# Patient Record
Sex: Female | Born: 1945 | Race: Asian | Hispanic: No | Marital: Married | Smoking: Never smoker
Health system: Southern US, Community
[De-identification: ages and names within clinical notes are randomized; demographics above are authoritative.]

## PROBLEM LIST (undated history)

## (undated) DIAGNOSIS — Z9889 Other specified postprocedural states: Secondary | ICD-10-CM

## (undated) DIAGNOSIS — M869 Osteomyelitis, unspecified: Secondary | ICD-10-CM

## (undated) HISTORY — PX: ABDOMINAL HYSTERECTOMY: SHX81

## (undated) HISTORY — DX: Other specified postprocedural states: Z98.890

## (undated) HISTORY — DX: Osteomyelitis, unspecified: M86.9

---

## 1998-10-09 ENCOUNTER — Inpatient Hospital Stay (HOSPITAL_COMMUNITY): Admission: EM | Admit: 1998-10-09 | Discharge: 1998-10-10 | Payer: Self-pay | Admitting: Emergency Medicine

## 1998-10-09 ENCOUNTER — Encounter: Payer: Self-pay | Admitting: Emergency Medicine

## 1999-04-11 ENCOUNTER — Other Ambulatory Visit: Admission: RE | Admit: 1999-04-11 | Discharge: 1999-04-11 | Payer: Self-pay | Admitting: Obstetrics and Gynecology

## 2006-11-24 ENCOUNTER — Observation Stay (HOSPITAL_COMMUNITY): Admission: EM | Admit: 2006-11-24 | Discharge: 2006-11-25 | Payer: Self-pay | Admitting: Family Medicine

## 2009-12-11 ENCOUNTER — Ambulatory Visit: Payer: Self-pay | Admitting: Vascular Surgery

## 2009-12-11 ENCOUNTER — Observation Stay (HOSPITAL_COMMUNITY): Admission: EM | Admit: 2009-12-11 | Discharge: 2009-12-13 | Payer: Self-pay | Admitting: Emergency Medicine

## 2009-12-11 ENCOUNTER — Encounter (INDEPENDENT_AMBULATORY_CARE_PROVIDER_SITE_OTHER): Payer: Self-pay | Admitting: Internal Medicine

## 2009-12-13 ENCOUNTER — Encounter: Payer: Self-pay | Admitting: Cardiology

## 2010-05-21 ENCOUNTER — Emergency Department (HOSPITAL_COMMUNITY): Admission: EM | Admit: 2010-05-21 | Discharge: 2010-05-21 | Payer: Self-pay | Admitting: Emergency Medicine

## 2011-02-19 LAB — COMPREHENSIVE METABOLIC PANEL
ALT: 28 U/L (ref 0–35)
AST: 16 U/L (ref 0–37)
AST: 22 U/L (ref 0–37)
Albumin: 3.9 g/dL (ref 3.5–5.2)
Albumin: 4.1 g/dL (ref 3.5–5.2)
Alkaline Phosphatase: 51 U/L (ref 39–117)
BUN: 11 mg/dL (ref 6–23)
BUN: 8 mg/dL (ref 6–23)
Chloride: 103 mEq/L (ref 96–112)
Chloride: 106 mEq/L (ref 96–112)
GFR calc Af Amer: >60 mL/min (ref >60)
Glucose, Bld: 110 mg/dL — ABNORMAL HIGH (ref 70–99)
Potassium: 4.3 mEq/L (ref 3.5–5.1)
Potassium: 3.9 mEq/L (ref 3.5–5.1)
Sodium: 141 mEq/L (ref 135–145)
Total Bilirubin: 1.3 mg/dL — ABNORMAL HIGH (ref 0.3–1.2)
Total Bilirubin: 0.8 mg/dL (ref 0.3–1.2)
Total Protein: 7.1 g/dL (ref 6.0–8.3)
Total Protein: 7.2 g/dL (ref 6.0–8.3)

## 2011-02-19 LAB — HEMOGLOBIN A1C
Hgb A1c MFr Bld: 5.9 % (ref 4.6–6.1)
Mean Plasma Glucose: 123 mg/dL

## 2011-02-19 LAB — PROTIME-INR
INR: 1.01 (ref 0.00–1.49)
Prothrombin Time: 13.2 seconds (ref 11.6–15.2)

## 2011-02-19 LAB — LIPID PANEL
Cholesterol: 231 mg/dL — ABNORMAL HIGH (ref 0–200)
Triglycerides: 118 mg/dL (ref <150)
VLDL: 24 mg/dL (ref 0–40)

## 2011-02-19 LAB — BASIC METABOLIC PANEL
Chloride: 103 mEq/L (ref 96–112)
Creatinine, Ser: 0.74 mg/dL (ref 0.4–1.2)
GFR calc Af Amer: >60 mL/min (ref >60)
GFR calc non Af Amer: >60 mL/min (ref >60)

## 2011-02-19 LAB — DIFFERENTIAL
Basophils Absolute: 0 10*3/uL (ref 0.0–0.1)
Basophils Absolute: 0 10*3/uL (ref 0.0–0.1)
Basophils Relative: 0 % (ref 0–1)
Basophils Relative: 0 % (ref 0–1)
Eosinophils Absolute: 0.1 10*3/uL (ref 0.0–0.7)
Eosinophils Absolute: 0 10*3/uL (ref 0.0–0.7)
Eosinophils Relative: 2 % (ref 0–5)
Eosinophils Relative: 0 % (ref 0–5)
Lymphocytes Relative: 33 % (ref 12–46)
Monocytes Absolute: 0.2 10*3/uL (ref 0.1–1.0)
Monocytes Relative: 5 % (ref 3–12)
Neutro Abs: 2.7 10*3/uL (ref 1.7–7.7)
Neutrophils Relative %: 60 % (ref 43–77)

## 2011-02-19 LAB — CK TOTAL AND CKMB (NOT AT ARMC)
CK, MB: 0.7 ng/mL (ref 0.3–4.0)
CK, MB: 0.8 ng/mL (ref 0.3–4.0)
CK, MB: 1.1 ng/mL (ref 0.3–4.0)
CK, MB: 1.3 ng/mL (ref 0.3–4.0)
Relative Index: INVALID (ref 0.0–2.5)
Relative Index: INVALID (ref 0.0–2.5)
Total CK: 62 U/L (ref 7–177)

## 2011-02-19 LAB — CARDIAC PANEL(CRET KIN+CKTOT+MB+TROPI)
CK, MB: 0.5 ng/mL (ref 0.3–4.0)
Relative Index: INVALID (ref 0.0–2.5)
Relative Index: INVALID (ref 0.0–2.5)
Total CK: 53 U/L (ref 7–177)
Troponin I: <0.01 ng/mL (ref 0.00–0.06)

## 2011-02-19 LAB — POCT CARDIAC MARKERS
CKMB, poc: <1 ng/mL — ABNORMAL LOW (ref 1.0–8.0)
Myoglobin, poc: 42.3 ng/mL (ref 12–200)
Myoglobin, poc: 47.5 ng/mL (ref 12–200)
Troponin i, poc: <0.05 ng/mL (ref 0.00–0.09)

## 2011-02-19 LAB — URINALYSIS, ROUTINE W REFLEX MICROSCOPIC
Hgb urine dipstick: NEGATIVE
Nitrite: NEGATIVE
Protein, ur: NEGATIVE mg/dL
Specific Gravity, Urine: 1.012 (ref 1.005–1.030)
Urobilinogen, UA: 0.2 mg/dL (ref 0.0–1.0)

## 2011-02-19 LAB — CBC
HCT: 41.5 % (ref 36.0–46.0)
MCV: 93.8 fL (ref 78.0–100.0)
Platelets: 257 10*3/uL (ref 150–400)
Platelets: 206 10*3/uL (ref 150–400)
WBC: 4.6 10*3/uL (ref 4.0–10.5)
WBC: 4.6 10*3/uL (ref 4.0–10.5)

## 2011-02-19 LAB — TROPONIN I: Troponin I: 0.02 ng/mL (ref 0.00–0.06)

## 2011-04-21 NOTE — H&P (Signed)
Sara Mason, Sara Mason              ACCOUNT NO.:  [id number]   MEDICAL RECORD NO.:  [id number]          PATIENT TYPE:  OBV   LOCATION:  3027                         FACILITY:  [facility]   PHYSICIAN:  Michelene Gardener, MD    DATE OF BIRTH:  [birth year]   DATE OF ADMISSION:  11/23/2006  DATE OF DISCHARGE:  11/25/2006                              HISTORY & PHYSICAL   PRIMARY PHYSICIAN:  Chales Salmon. Abigail Miyamoto, M.D.   DISCHARGE DIAGNOSES:  Transient global amnesia.   DISCHARGE MEDICATIONS:  None.   CONSULTATIONS:  Neurology consult with Dr. Vickey Huger.   PROCEDURES:  1. A CT scan of the head, came to be normal.  2. MRI of the head came to be normal.   FOLLOWUP APPOINTMENT:  1. Dr. Pricilla Holm in one to two weeks.  2. Dr. Vickey Huger in seven to ten days.   GENERAL CONDITION AT THE TIME OF DISCHARGE:  Patient is stable.   REASON FOR HOSPITALIZATION:  This is a 65 year old female who brought by  her family because she developed acute onset of amnesia where she cannot  remember anything.   COURSE OF HOSPITALIZATION BY MEDICAL PROBLEM:  This patient was seen in  the ER.  CT scan of the head was done in the ER and it came to be  normal.  MRI of the brain was also done in the emergency room and again  it was normal.  Patient was admitted to the floor for observation and  her condition improved very quick.  Over 2 days she is almost back to  normal.  She is able to remember things.  Neurology consultation was  done by Dr. Vickey Huger who stated that patient will be okay to go home and  needs followup with neurologist on outpatient.  The  patient will be given an appointment to see Dr. Vickey Huger in 7 to 10  days.  An EEG will be done as an outpatient for further assessment.  At  the time of discharge, patient is stable, is almost back to normal with  no new complaints.  Assessment time is 40 minutes.      Michelene Gardener, MD  Electronically Signed     NAE/MEDQ  D:  11/25/2006  T:  11/26/2006  Job:   [id number]   cc:   Melvyn Novas, M.D.  Chales Salmon. Abigail Miyamoto, M.D.

## 2011-04-21 NOTE — H&P (Signed)
Sara Mason, FRIESEN              ACCOUNT NO.:  [id number]   MEDICAL RECORD NO.:  [id number]          PATIENT TYPE:  EMS   LOCATION:  MAJO                         FACILITY:  [facility]   PHYSICIAN:  Melissa L. Ladona Ridgel, MD  DATE OF BIRTH:  [birth year]   DATE OF ADMISSION:  11/24/2006  DATE OF DISCHARGE:                              HISTORY & PHYSICAL   PRIMARY CARE PHYSICIAN:  Dr. Abigail Miyamoto.   CHIEF COMPLAINT:  Memory loss, acute in onset.   HISTORY OF PRESENT ILLNESS:  The patient is a 65 year old Asian female  who presents from her home with acute onset of global amnesia.  The  patient evidently went out to the pastures to put her horses up and  returned to the house sometime later.  She called out to her husband and  stated, I think I have a concussion.  He inquired as to why she  thought that and noted that she was displaying a similar pattern of  memory deficit as she had approximately 10 years ago.  The patient's  spouse relates that 10 years ago the patient had a similar traumatic  concussion.  Memory deficits were noted where she was not able to  identify simple things for approximately 1 day; symptoms resolved with  no further memory deficit.  At this time, the patient complains of mild  headache, but no other traumatic areas are noted and no marks were noted  on her body.  She did have some shin pain that was there with flexion of  her legs, but again, no dirtied clothes, no evidence of being kicked or  knocked down in the dirt.   REVIEW OF SYSTEMS:  Currently a slight headache.  No stiffness.  She has  had a swollen eye, but that has been there for a couple of days and  actually has mild injection.   PAST MEDICAL HISTORY:  None.   ALLERGIES:  She does have an allergic reaction to BEE STINGS and so she  carries an EpiPen.  She is allergic to NSAIDS.   PAST SURGICAL HISTORY:  1. She had a hysterectomy.  2. She has had dental procedures.   SOCIAL HISTORY:  She denies  tobacco.  She does have occasional wine.  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   FAMILY HISTORY:  Dad is deceased.  Mom is living with diabetes and  bypass, as well as a history of dementia.   MEDICATIONS:  None.   PHYSICAL EXAMINATION:  VITAL SIGNS:  Temperature is 99.4, blood pressure  122/72, pulse is 81, respirations 20, saturation 97%.  GENERAL:  This is a well-nourished, well-developed Asian female in no  acute distress, other than perseverating questions of a simple nature  including her clothes and why she is in the hospital.  HEENT:  She is normocephalic, does not appear to have any trauma.  Her  tympanic membranes are intact without any evidence of blood behind them.  Her right eye has mild injection in the corner.  Her mucous membranes  are moist.  Funduscopic exam reveals no ruptured blood vessels.  NECK:  Supple.  There is no JVD, no lymph nodes and no tenderness to her  spine.  There are no carotid bruits, no thyromegaly.  CHEST:  Clear to auscultation.  There are no rhonchi, rales or wheezes.  CARDIOVASCULAR:  Regular rate and rhythm, positive S1 and S2.  No S3 or  S4.  No murmurs, rubs, or gallops.  ABDOMEN:  Soft, nontender and non-distended with positive bowel sounds.  EXTREMITIES:  No clubbing, cyanosis or edema, or injury and she appears  nontender.   LABORATORY DATA:  There are no current laboratory values.   ASSESSMENT AND PLAN:  This is a 65 year old Asian female with  transglobal amnesia of unclear inciting factor.  There is no evidence  for acute trauma; however, a headbutt by a horse may not necessarily  have left her with injury, but she does not appear to have gone down or  been kicked.  CT and MRI show no preliminary acute changes.  We will  admit the patient for observation.   1. Admit for observation.  Neurological checks every 2 hours.  If her      cognitive capacity is not clearing in the morning, then we will      consult Neurology.   Please note that the emergency room did speak      with the neurologist on call; he felt that general observation was      appropriate.  2. We will check a BMET, CBC, UA and TSH.      Melissa L. Ladona Ridgel, MD  Electronically Signed     MLT/MEDQ  D:  11/24/2006  T:  11/24/2006  Job:  [id number]   cc:   Chales Salmon. Abigail Miyamoto, M.D.

## 2011-04-21 NOTE — Consult Note (Signed)
Sara Mason, DUKES              ACCOUNT NO.:  [id number]   MEDICAL RECORD NO.:  [id number]          PATIENT TYPE:  OBV   LOCATION:  3027                         FACILITY:  [facility]   PHYSICIAN:  Melvyn Novas, M.D.  DATE OF BIRTH:  1946/01/21   DATE OF CONSULTATION:  11/24/2006  DATE OF DISCHARGE:  11/25/2006                                 CONSULTATION   The patient is in the outpatient setting followed by Dr. Henrine Screws.   CHIEF COMPLAINT:  Memory loss, acute in onset amnesia.   HISTORY OF PRESENT ILLNESS:  This patient is a 65 year old Asian  American female who presents from her home with acute onset of global  amnesia.  She was [facility] her [facility] and then to [facility] for a CT scan.  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  Last night she went out to  the pastures to bring the horses into the stables, and she returned to  the house sometimes later.  Her husband heard her calling out.  She  stated to him, I think I had a concussion, and she explained that she  cannot remember how she got back to the house or what she did out in the  pasture.  She considered that she might have been kicked by a horse,  because she found herself on the ground not knowing where she was.  Eight years ago she had a similar amnestic event after a trauma, and it  was therefore a conclusion that this might have been of repeated  incident, although, she does not recall being in any way injured.  This  morning, she felt a sore spot on her right occipital parietal scalp.  She also had a sore spot on the inside of her left calf, but she has no  bruising.  There is a tongue bite mark on her left edge of the tongue.  She has no history of seizures.   REVIEW OF SYSTEMS:  The patient had a slight headache last night which  has now resolved.  She has no stiffness.  She does have a tender spot as  described above.   PAST MEDICAL  HISTORY:  Negative.   ALLERGIES:  No known drug allergies, but was told not to take NSAIDs.   PAST SURGICAL HISTORY:  Positive for hysterectomy and several dental  procedures.  The patient is a nonsmoker, nondrinker, except for  occasional wine.  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   FAMILY HISTORY:  Her father is deceased.  Her mother is living with  diabetes and coronary artery disease, coronary stenting and bypass  surgery as well as developed dementia.  She is on no medications at this  point.   PHYSICAL EXAMINATION:  VITAL SIGNS:  Temperature is 98.7, blood pressure  is 102/68, pulse rate is 62 and regular, respirations 16.  LUNGS:  Clear to auscultation.  Oxygen saturation on room air 98%.  GENERAL:  The patient is well-nourished, well-groomed a fully alert.  She is at this time fully  oriented in no acute distress.  She has not  continued to perseverate questions as described this morning by her  admitting physician.  HEENT:  She is normocephalic and traumatic except for the tongue bite.  Funduscopic examination has no __________  NECK:  Supple.  No neck vein distension.  No clubbing, cyanosis or  edema.  NEUROLOGICAL:  Cranial nerve examination shows bilaterally equal  extraocular movements, bilateral equal eye closure.  Tongue and uvula  move in midline.  There is no facial asymmetry noted.  Motor examination  shows 5/5 strength bilaterally without rigor, cogwheeling or tremor.  No  drift on finger-nose test.  Bilateral equal grip strength.  No  dysmetria.  No ataxia.  Gait is unimpaired.  The patient had walked to  the bathroom several times today.  Deep tendon reflexes are symmetric.  Downgoing toes to plantar stimulation.  Sensory:  Intact to primary  modalities.  Antigravity movement for over 10 seconds could be performed  in all four extremities.   CONCLUSION:  Likely suffering from global amnesia.  It is unclear to me  if the patient has truly had a trauma or  if it could have been a seizure  given the tongue bite.  I suggested since it is a holiday weekend that  the patient will be discharged tomorrow.  For this might, I would like  her to have an EKG electrode to just use the benefit of being  hospitalized and evaluate her for any cardiac arrhythmia.  Tomorrow, I  would like her to be discharged home, to keep rest at home for the next  10 days, and I would like to follow-up with her in 14 days in the  outpatient setting at my office at [facility], [address], [address].  I would also like to obtain in the  outpatient setting an EEG.   ADDENDUM:  The patient had a normal CT and MRI scan which I reviewed  here in the hospital.      Melvyn Novas, M.D.  Electronically Signed     CD/MEDQ  D:  11/24/2006  T:  11/26/2006  Job:  [id number]   cc:   Chales Salmon. Abigail Miyamoto, M.D.  Melissa L. Ladona Ridgel, MD

## 2012-04-05 ENCOUNTER — Other Ambulatory Visit: Payer: Self-pay | Admitting: Cardiology

## 2015-05-31 DIAGNOSIS — H532 Diplopia: Secondary | ICD-10-CM | POA: Diagnosis not present

## 2015-05-31 DIAGNOSIS — H52203 Unspecified astigmatism, bilateral: Secondary | ICD-10-CM | POA: Diagnosis not present

## 2016-01-05 DIAGNOSIS — Z1231 Encounter for screening mammogram for malignant neoplasm of breast: Secondary | ICD-10-CM | POA: Diagnosis not present

## 2016-03-24 DIAGNOSIS — Z86010 Personal history of colonic polyps: Secondary | ICD-10-CM | POA: Diagnosis not present

## 2016-05-11 DIAGNOSIS — S32000A Wedge compression fracture of unspecified lumbar vertebra, initial encounter for closed fracture: Secondary | ICD-10-CM | POA: Diagnosis not present

## 2016-05-11 DIAGNOSIS — M5442 Lumbago with sciatica, left side: Secondary | ICD-10-CM | POA: Diagnosis not present

## 2016-05-23 DIAGNOSIS — S32000D Wedge compression fracture of unspecified lumbar vertebra, subsequent encounter for fracture with routine healing: Secondary | ICD-10-CM | POA: Diagnosis not present

## 2016-05-30 DIAGNOSIS — M5442 Lumbago with sciatica, left side: Secondary | ICD-10-CM | POA: Diagnosis not present

## 2016-05-30 DIAGNOSIS — S32010D Wedge compression fracture of first lumbar vertebra, subsequent encounter for fracture with routine healing: Secondary | ICD-10-CM | POA: Diagnosis not present

## 2017-02-06 DIAGNOSIS — G43909 Migraine, unspecified, not intractable, without status migrainosus: Secondary | ICD-10-CM | POA: Diagnosis not present

## 2017-04-11 DIAGNOSIS — H2513 Age-related nuclear cataract, bilateral: Secondary | ICD-10-CM | POA: Diagnosis not present

## 2017-04-11 DIAGNOSIS — H4320 Crystalline deposits in vitreous body, unspecified eye: Secondary | ICD-10-CM | POA: Diagnosis not present

## 2017-07-13 DIAGNOSIS — Z1231 Encounter for screening mammogram for malignant neoplasm of breast: Secondary | ICD-10-CM | POA: Diagnosis not present

## 2017-10-29 DIAGNOSIS — H35073 Retinal telangiectasis, bilateral: Secondary | ICD-10-CM | POA: Diagnosis not present

## 2018-05-06 DIAGNOSIS — H35073 Retinal telangiectasis, bilateral: Secondary | ICD-10-CM | POA: Diagnosis not present

## 2018-08-14 DIAGNOSIS — Z1231 Encounter for screening mammogram for malignant neoplasm of breast: Secondary | ICD-10-CM | POA: Diagnosis not present

## 2018-11-06 DIAGNOSIS — H52203 Unspecified astigmatism, bilateral: Secondary | ICD-10-CM | POA: Diagnosis not present

## 2018-11-06 DIAGNOSIS — H2513 Age-related nuclear cataract, bilateral: Secondary | ICD-10-CM | POA: Diagnosis not present

## 2018-11-06 DIAGNOSIS — H35073 Retinal telangiectasis, bilateral: Secondary | ICD-10-CM | POA: Diagnosis not present

## 2019-08-20 DIAGNOSIS — Z1231 Encounter for screening mammogram for malignant neoplasm of breast: Secondary | ICD-10-CM | POA: Diagnosis not present

## 2019-08-20 DIAGNOSIS — Z803 Family history of malignant neoplasm of breast: Secondary | ICD-10-CM | POA: Diagnosis not present

## 2019-11-12 DIAGNOSIS — H2513 Age-related nuclear cataract, bilateral: Secondary | ICD-10-CM | POA: Diagnosis not present

## 2019-11-12 DIAGNOSIS — H35383 Toxic maculopathy, bilateral: Secondary | ICD-10-CM | POA: Diagnosis not present

## 2019-11-12 DIAGNOSIS — H5201 Hypermetropia, right eye: Secondary | ICD-10-CM | POA: Diagnosis not present

## 2020-09-08 DIAGNOSIS — Z1231 Encounter for screening mammogram for malignant neoplasm of breast: Secondary | ICD-10-CM | POA: Diagnosis not present

## 2020-09-24 DIAGNOSIS — R928 Other abnormal and inconclusive findings on diagnostic imaging of breast: Secondary | ICD-10-CM | POA: Diagnosis not present

## 2020-11-16 DIAGNOSIS — H52203 Unspecified astigmatism, bilateral: Secondary | ICD-10-CM | POA: Diagnosis not present

## 2020-11-16 DIAGNOSIS — H2513 Age-related nuclear cataract, bilateral: Secondary | ICD-10-CM | POA: Diagnosis not present

## 2020-11-16 DIAGNOSIS — H35383 Toxic maculopathy, bilateral: Secondary | ICD-10-CM | POA: Diagnosis not present

## 2020-12-23 DIAGNOSIS — R7302 Impaired glucose tolerance (oral): Secondary | ICD-10-CM | POA: Diagnosis not present

## 2020-12-23 DIAGNOSIS — E785 Hyperlipidemia, unspecified: Secondary | ICD-10-CM | POA: Diagnosis not present

## 2020-12-30 DIAGNOSIS — E785 Hyperlipidemia, unspecified: Secondary | ICD-10-CM | POA: Diagnosis not present

## 2020-12-30 DIAGNOSIS — Z0000 Encounter for general adult medical examination without abnormal findings: Secondary | ICD-10-CM | POA: Diagnosis not present

## 2020-12-30 DIAGNOSIS — R7302 Impaired glucose tolerance (oral): Secondary | ICD-10-CM | POA: Diagnosis not present

## 2021-04-20 DIAGNOSIS — R202 Paresthesia of skin: Secondary | ICD-10-CM | POA: Diagnosis not present

## 2021-04-20 DIAGNOSIS — R7302 Impaired glucose tolerance (oral): Secondary | ICD-10-CM | POA: Diagnosis not present

## 2021-04-20 DIAGNOSIS — M79662 Pain in left lower leg: Secondary | ICD-10-CM | POA: Diagnosis not present

## 2021-04-20 DIAGNOSIS — E785 Hyperlipidemia, unspecified: Secondary | ICD-10-CM | POA: Diagnosis not present

## 2021-05-13 DIAGNOSIS — R300 Dysuria: Secondary | ICD-10-CM | POA: Diagnosis not present

## 2021-11-21 DIAGNOSIS — H35363 Drusen (degenerative) of macula, bilateral: Secondary | ICD-10-CM | POA: Diagnosis not present

## 2021-11-21 DIAGNOSIS — H2513 Age-related nuclear cataract, bilateral: Secondary | ICD-10-CM | POA: Diagnosis not present

## 2021-11-21 DIAGNOSIS — H524 Presbyopia: Secondary | ICD-10-CM | POA: Diagnosis not present

## 2021-12-28 DIAGNOSIS — R7302 Impaired glucose tolerance (oral): Secondary | ICD-10-CM | POA: Diagnosis not present

## 2021-12-28 DIAGNOSIS — E785 Hyperlipidemia, unspecified: Secondary | ICD-10-CM | POA: Diagnosis not present

## 2022-01-04 DIAGNOSIS — R82998 Other abnormal findings in urine: Secondary | ICD-10-CM | POA: Diagnosis not present

## 2022-01-04 DIAGNOSIS — E785 Hyperlipidemia, unspecified: Secondary | ICD-10-CM | POA: Diagnosis not present

## 2022-01-04 DIAGNOSIS — Z1331 Encounter for screening for depression: Secondary | ICD-10-CM | POA: Diagnosis not present

## 2022-01-04 DIAGNOSIS — Z1339 Encounter for screening examination for other mental health and behavioral disorders: Secondary | ICD-10-CM | POA: Diagnosis not present

## 2022-01-04 DIAGNOSIS — R7302 Impaired glucose tolerance (oral): Secondary | ICD-10-CM | POA: Diagnosis not present

## 2022-01-04 DIAGNOSIS — Z0000 Encounter for general adult medical examination without abnormal findings: Secondary | ICD-10-CM | POA: Diagnosis not present

## 2022-01-05 ENCOUNTER — Other Ambulatory Visit: Payer: Self-pay | Admitting: Internal Medicine

## 2022-01-05 DIAGNOSIS — E785 Hyperlipidemia, unspecified: Secondary | ICD-10-CM

## 2022-01-12 DIAGNOSIS — Z1231 Encounter for screening mammogram for malignant neoplasm of breast: Secondary | ICD-10-CM | POA: Diagnosis not present

## 2022-02-14 ENCOUNTER — Ambulatory Visit
Admission: RE | Admit: 2022-02-14 | Discharge: 2022-02-14 | Disposition: A | Discharge status: Home / Self Care | Payer: No Typology Code available for payment source | Source: Ambulatory Visit | Attending: Internal Medicine | Admitting: Internal Medicine

## 2022-02-14 DIAGNOSIS — E785 Hyperlipidemia, unspecified: Secondary | ICD-10-CM

## 2022-04-20 DIAGNOSIS — Z86010 Personal history of colonic polyps: Secondary | ICD-10-CM | POA: Diagnosis not present

## 2022-04-27 ENCOUNTER — Other Ambulatory Visit: Payer: Self-pay | Admitting: Gastroenterology

## 2022-04-27 DIAGNOSIS — Z86010 Personal history of colonic polyps: Secondary | ICD-10-CM

## 2022-06-22 ENCOUNTER — Ambulatory Visit
Admission: RE | Admit: 2022-06-22 | Discharge: 2022-06-22 | Disposition: A | Discharge status: Home / Self Care | Payer: Medicare HMO | Source: Ambulatory Visit | Attending: Gastroenterology | Admitting: Gastroenterology

## 2022-06-22 DIAGNOSIS — Z86010 Personal history of colonic polyps: Secondary | ICD-10-CM

## 2022-08-02 DIAGNOSIS — L280 Lichen simplex chronicus: Secondary | ICD-10-CM | POA: Diagnosis not present

## 2022-09-18 DIAGNOSIS — N8111 Cystocele, midline: Secondary | ICD-10-CM | POA: Diagnosis not present

## 2022-10-03 DIAGNOSIS — N8110 Cystocele, unspecified: Secondary | ICD-10-CM | POA: Diagnosis not present

## 2022-10-03 DIAGNOSIS — Z4689 Encounter for fitting and adjustment of other specified devices: Secondary | ICD-10-CM | POA: Diagnosis not present

## 2022-10-17 DIAGNOSIS — Z4689 Encounter for fitting and adjustment of other specified devices: Secondary | ICD-10-CM | POA: Diagnosis not present

## 2022-12-02 IMAGING — CT CT CARDIAC CORONARY ARTERY CALCIUM SCORE
3 series · 13 of 20 positions shown, 15 images · non-contrast
Comparison: None.

CLINICAL DATA: 75-year-old Asian female with history of
hyperlipidemia and family history of heart disease.

EXAM:
CT CARDIAC CORONARY ARTERY CALCIUM SCORE
TECHNIQUE: Non-contrast imaging through the heart was performed using
prospective ECG gating. Image post processing was performed on an
independent workstation, allowing for quantitative analysis of the
heart and coronary arteries. Note that this exam targets the heart
and the chest was not imaged in its entirety.

[Series 2: calcium scoring 2.00 qr36 bestdiast 70% hrt calciu · axial · 0.36mm/px · z∈[+1458,+1522]mm · 3 of 80 slices shown]
[im 16/80  vessel]
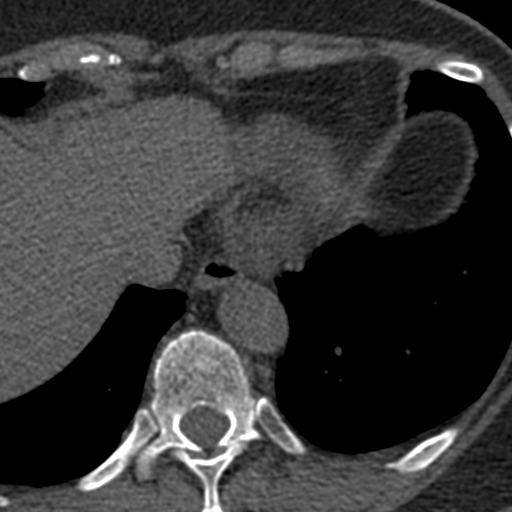
[im 32/80  vessel]
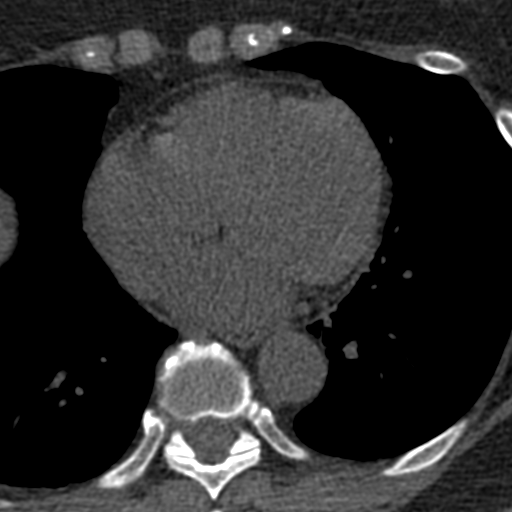
[im 48/80  vessel]
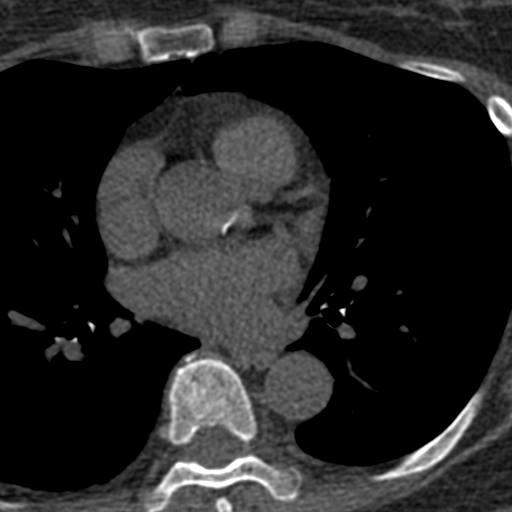

[Series 3: calcium scoring 2.00 br40 bestdiast 70% axial · axial · 0.54mm/px · z∈[+1454,+1558]mm · 5 of 80 slices shown, 7 images]
[im 14/80  vessel]
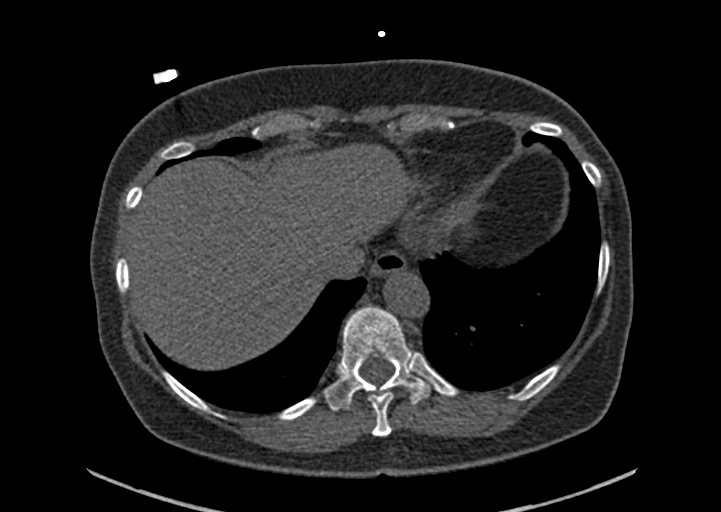
[im 14/80  lung]
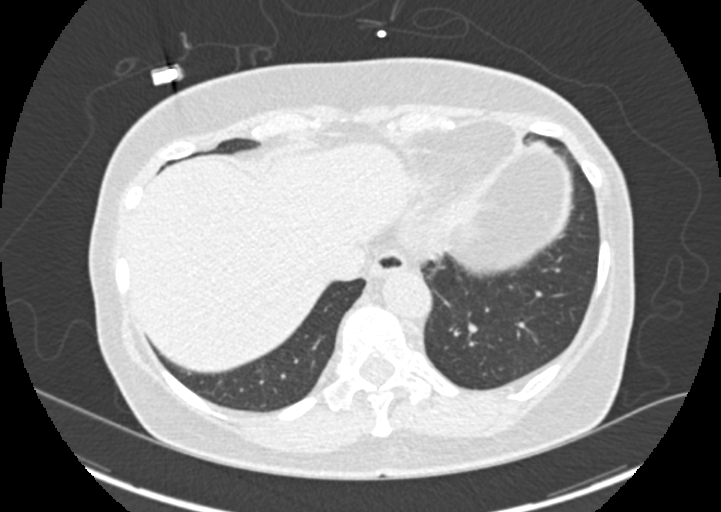
[im 27/80  vessel]
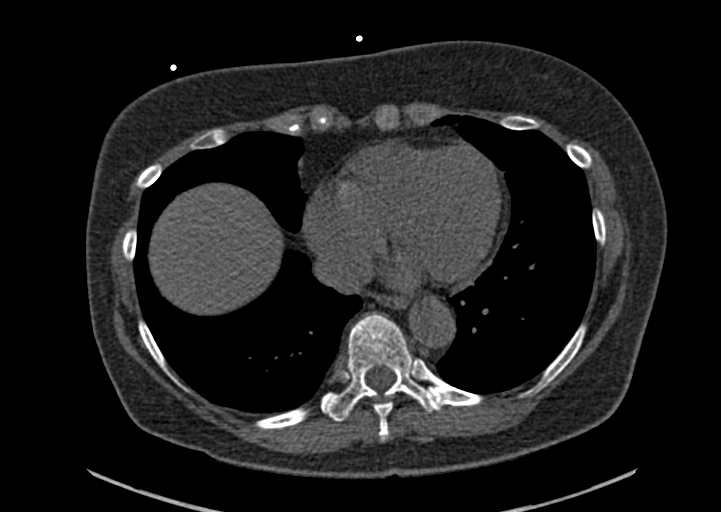
[im 40/80  vessel]
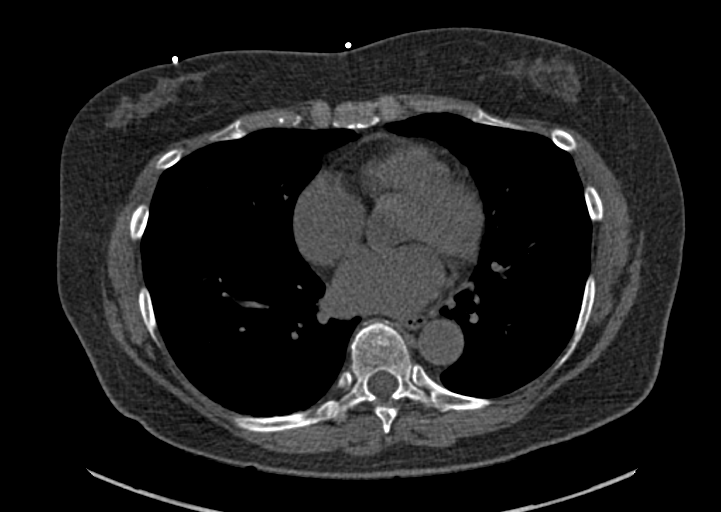
[im 53/80  vessel]
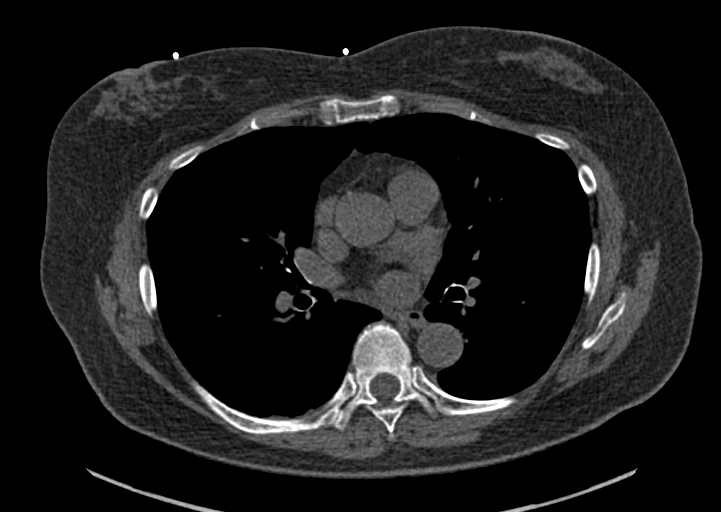
[im 66/80  vessel]
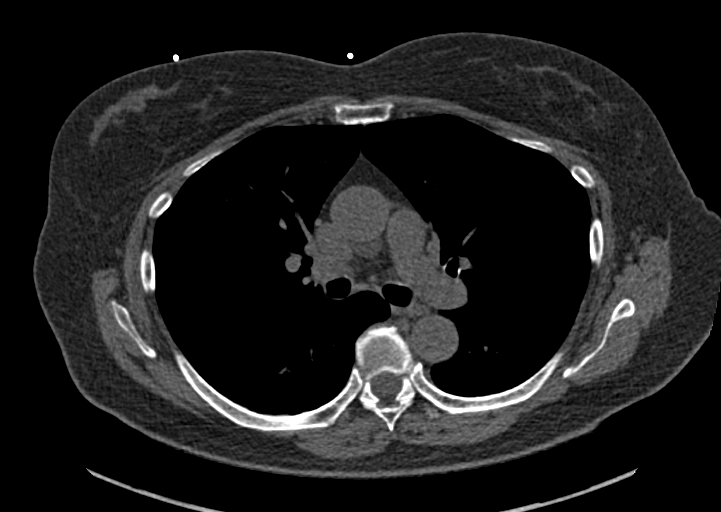
[im 66/80  lung]
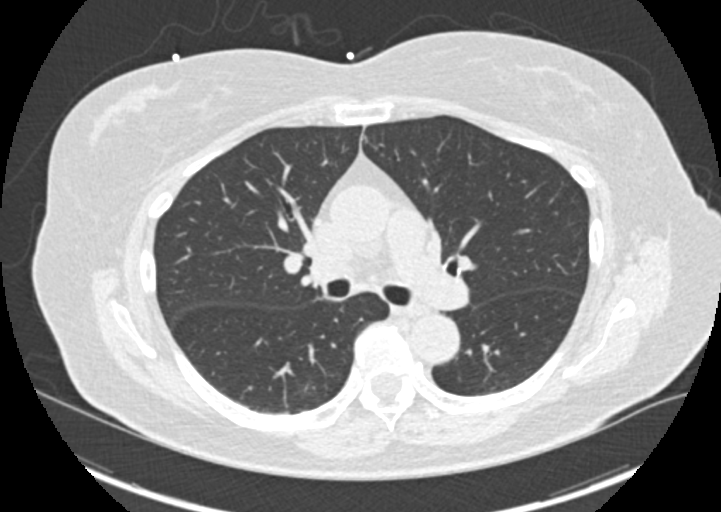

[Series 9: calcium scoring 2.00 br60 bestdiast 70% lungs · axial · 0.54mm/px · z∈[+1454,+1558]mm · 5 of 80 slices shown]
[im 14/80  vessel]
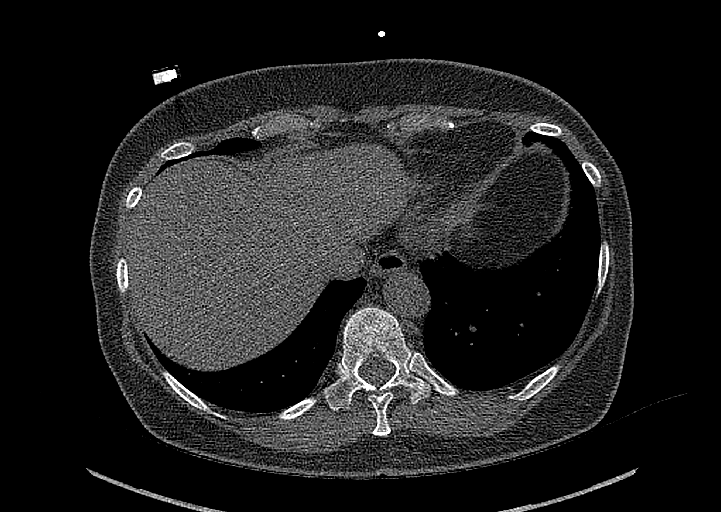
[im 27/80  vessel]
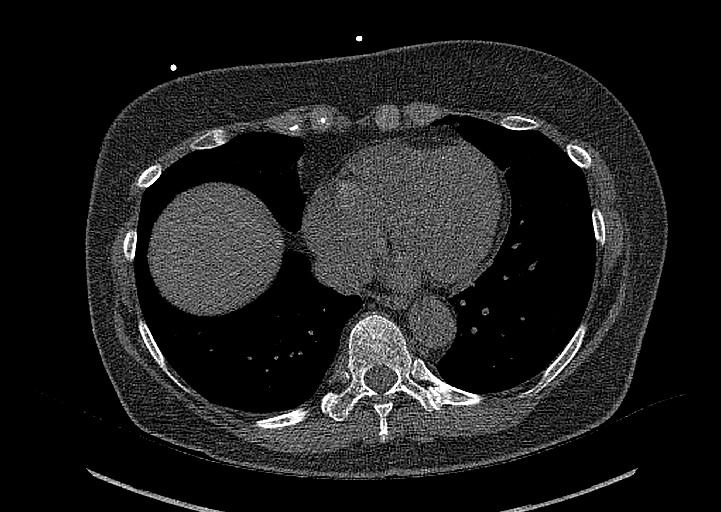
[im 40/80  vessel]
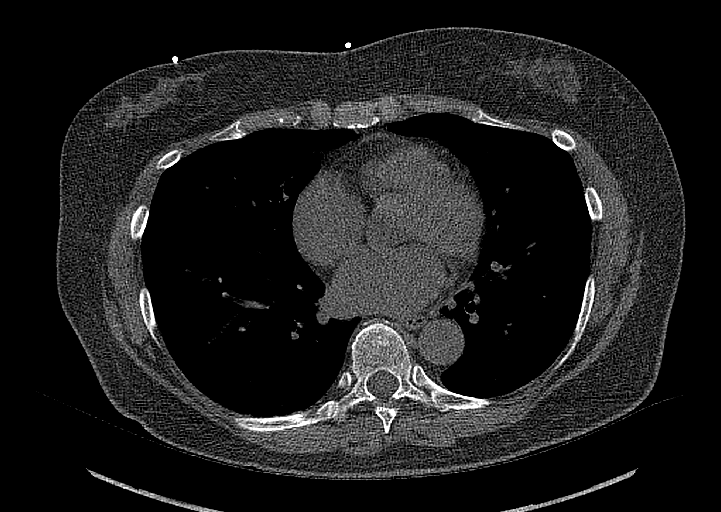
[im 53/80  vessel]
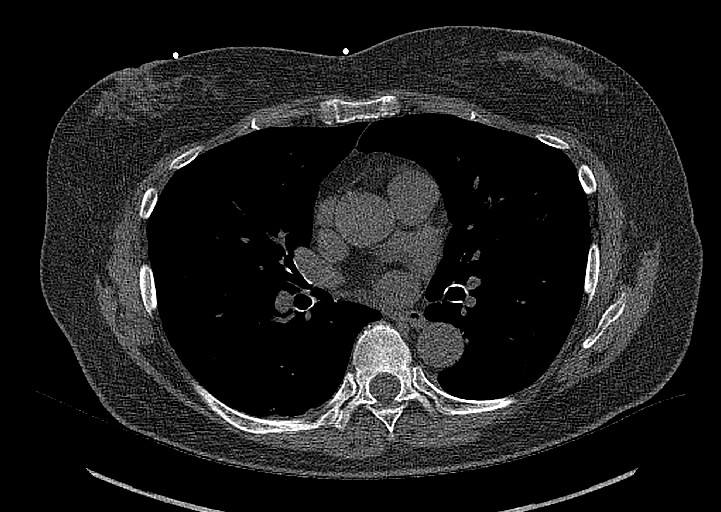
[im 66/80  vessel]
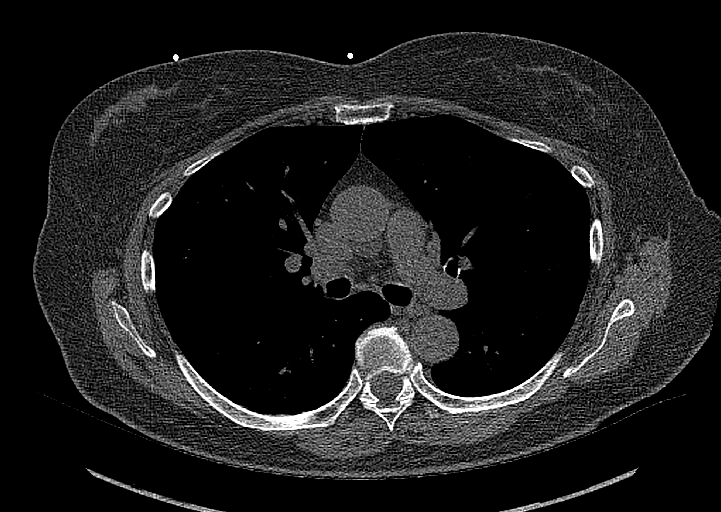

[13 of 20 positions shown; findings below may reference images not displayed]

FINDINGS: CORONARY CALCIUM SCORES:

Left Main: 0

LAD: 0

LCx: 0

RCA: 0

Total Agatston Score: 0

[HOSPITAL] percentile: 0

AORTA MEASUREMENTS:

Ascending Aorta: 29 mm

Descending Aorta: 24 mm

OTHER FINDINGS:

The heart size is within normal limits. No pericardial fluid is
identified. Mild atherosclerosis of the thoracic aorta. Visualized
segments of the thoracic aorta and central pulmonary arteries are
normal in caliber. Visualized mediastinum and hilar regions
demonstrate no lymphadenopathy or masses. Visualized lungs show no
evidence of pulmonary edema, consolidation, pneumothorax, nodule or
pleural fluid. Visualized upper abdomen and bony structures are
unremarkable.
IMPRESSION: 1. Coronary calcium score of 0.
2. Mild atherosclerosis of the thoracic aorta.

## 2022-12-04 HISTORY — PX: CATARACT EXTRACTION: SUR2

## 2022-12-05 DIAGNOSIS — H52203 Unspecified astigmatism, bilateral: Secondary | ICD-10-CM | POA: Diagnosis not present

## 2022-12-05 DIAGNOSIS — H2513 Age-related nuclear cataract, bilateral: Secondary | ICD-10-CM | POA: Diagnosis not present

## 2022-12-13 DIAGNOSIS — K048 Radicular cyst: Secondary | ICD-10-CM | POA: Diagnosis not present

## 2022-12-13 DIAGNOSIS — M795 Residual foreign body in soft tissue: Secondary | ICD-10-CM | POA: Diagnosis not present

## 2023-01-16 DIAGNOSIS — D72819 Decreased white blood cell count, unspecified: Secondary | ICD-10-CM | POA: Diagnosis not present

## 2023-01-16 DIAGNOSIS — R7989 Other specified abnormal findings of blood chemistry: Secondary | ICD-10-CM | POA: Diagnosis not present

## 2023-01-16 DIAGNOSIS — R7302 Impaired glucose tolerance (oral): Secondary | ICD-10-CM | POA: Diagnosis not present

## 2023-01-16 DIAGNOSIS — E785 Hyperlipidemia, unspecified: Secondary | ICD-10-CM | POA: Diagnosis not present

## 2023-01-18 DIAGNOSIS — Z1231 Encounter for screening mammogram for malignant neoplasm of breast: Secondary | ICD-10-CM | POA: Diagnosis not present

## 2023-01-18 DIAGNOSIS — Z4689 Encounter for fitting and adjustment of other specified devices: Secondary | ICD-10-CM | POA: Diagnosis not present

## 2023-01-24 DIAGNOSIS — Z1339 Encounter for screening examination for other mental health and behavioral disorders: Secondary | ICD-10-CM | POA: Diagnosis not present

## 2023-01-24 DIAGNOSIS — Z23 Encounter for immunization: Secondary | ICD-10-CM | POA: Diagnosis not present

## 2023-01-24 DIAGNOSIS — R7302 Impaired glucose tolerance (oral): Secondary | ICD-10-CM | POA: Diagnosis not present

## 2023-01-24 DIAGNOSIS — M868X8 Other osteomyelitis, other site: Secondary | ICD-10-CM | POA: Diagnosis not present

## 2023-01-24 DIAGNOSIS — E785 Hyperlipidemia, unspecified: Secondary | ICD-10-CM | POA: Diagnosis not present

## 2023-01-24 DIAGNOSIS — Z0000 Encounter for general adult medical examination without abnormal findings: Secondary | ICD-10-CM | POA: Diagnosis not present

## 2023-01-24 DIAGNOSIS — E663 Overweight: Secondary | ICD-10-CM | POA: Diagnosis not present

## 2023-01-24 DIAGNOSIS — Z1331 Encounter for screening for depression: Secondary | ICD-10-CM | POA: Diagnosis not present

## 2023-01-24 DIAGNOSIS — R82998 Other abnormal findings in urine: Secondary | ICD-10-CM | POA: Diagnosis not present

## 2023-01-31 DIAGNOSIS — E785 Hyperlipidemia, unspecified: Secondary | ICD-10-CM | POA: Diagnosis not present

## 2023-01-31 DIAGNOSIS — Z886 Allergy status to analgesic agent status: Secondary | ICD-10-CM | POA: Diagnosis not present

## 2023-01-31 DIAGNOSIS — Z792 Long term (current) use of antibiotics: Secondary | ICD-10-CM | POA: Diagnosis not present

## 2023-01-31 DIAGNOSIS — M272 Inflammatory conditions of jaws: Secondary | ICD-10-CM | POA: Diagnosis not present

## 2023-03-13 DIAGNOSIS — M272 Inflammatory conditions of jaws: Secondary | ICD-10-CM | POA: Diagnosis not present

## 2023-03-20 DIAGNOSIS — M272 Inflammatory conditions of jaws: Secondary | ICD-10-CM | POA: Diagnosis not present

## 2023-05-03 DIAGNOSIS — R300 Dysuria: Secondary | ICD-10-CM | POA: Diagnosis not present

## 2023-05-03 DIAGNOSIS — Z4689 Encounter for fitting and adjustment of other specified devices: Secondary | ICD-10-CM | POA: Diagnosis not present

## 2023-05-17 DIAGNOSIS — N8111 Cystocele, midline: Secondary | ICD-10-CM | POA: Diagnosis not present

## 2023-05-17 DIAGNOSIS — Z4689 Encounter for fitting and adjustment of other specified devices: Secondary | ICD-10-CM | POA: Diagnosis not present

## 2023-06-03 NOTE — Progress Notes (Unsigned)
[facility] New Patient Evaluation and Consultation  Referring Provider: Geoffry Paradise, MD PCP: Geoffry Paradise, MD Date of Service: 06/04/2023  SUBJECTIVE Chief Complaint: No chief complaint on file.  History of Present Illness: Sara Mason is a 77 y.o. {ED SANE VVOH:60737} female seen in consultation at the request of Dr. Jacky Kindle for evaluation of pelvic organ prolapse.    ***Review of records significant for: ***  Urinary Symptoms: {urine leakage?:24754} Leaks *** time(s) per {days/wks/mos/yrs:310907}.  Pad use: {NUMBERS 1-10:18281} {pad option:24752} per day.   She {ACTION; IS/IS TGG:26948546} bothered by her UI symptoms.  Day time voids ***.  Nocturia: *** times per night to void. Voiding dysfunction: she {empties:24755} her bladder well.  {DOES NOT does:27190::"does not"} use a catheter to empty bladder.  When urinating, she feels {urine symptoms:24756} Drinks: *** per day  UTIs: {NUMBERS 1-10:18281} UTI's in the last year.   {ACTIONS;DENIES/REPORTS:21021675::"Denies"} history of {urologic concerns:24757}  Pelvic Organ Prolapse Symptoms:                  She {denies/ admits to:24761} a feeling of a bulge the vaginal area. It has been present for {NUMBER 1-10:22536} {days/wks/mos/yrs:310907}.  She {denies/ admits to:24761} seeing a bulge.  This bulge {ACTION; IS/IS EVO:35009381} bothersome.  Bowel Symptom: Bowel movements: *** time(s) per {Time; day/week/month:13537} Stool consistency: {stool consistency:24758} Straining: {yes/no:19897}.  Splinting: {yes/no:19897}.  Incomplete evacuation: {yes/no:19897}.  She {denies/ admits to:24761} accidental bowel leakage / fecal incontinence  Occurs: *** time(s) per {Time; day/week/month:13537}  Consistency with leakage: {stool consistency:24758} Bowel regimen: {bowel regimen:24759} Last colonoscopy: Date ***, Results ***  Sexual Function Sexually active: {yes/no:19897}.  Sexual orientation: {Sexual  [phone]} Pain with sex: {pain with sex:24762}  Pelvic Pain {denies/ admits to:24761} pelvic pain Location: *** Pain occurs: *** Prior pain treatment: *** Improved by: *** Worsened by: ***   Past Medical History: No past medical history on file.   Past Surgical History:  *** The histories are not reviewed yet. Please review them in the "History" navigator section and refresh this SmartLink.   Past OB/GYN History: G{NUMBERS 1-10:18281} P{NUMBERS 1-10:18281} Vaginal deliveries: ***,  Forceps/ Vacuum deliveries: ***, Cesarean section: *** Menopausal: {menopausal:24763} Contraception: ***. Last pap smear was ***.  Any history of abnormal pap smears: {yes/no:19897}.   Medications: She currently has no medications in their medication list.   Allergies: Patient has no allergies on file.   Social History:    Relationship status: {relationship status:24764} She lives with ***.   She {ACTION; IS/IS WEX:93716967} employed ***. Regular exercise: {Yes/No:304960894} History of abuse: {Yes/No:304960894}  Family History:  No family history on file.   Review of Systems: ROS   OBJECTIVE Physical Exam: There were no vitals filed for this visit.  Physical Exam   GU / Detailed Urogynecologic Evaluation:  Pelvic Exam: Normal external female genitalia; Bartholin's and Skene's glands normal in appearance; urethral meatus normal in appearance, no urethral masses or discharge.   CST: {gen negative/positive:315881}  Reflexes: bulbocavernosis {DESC; PRESENT/NOT PRESENT:21021351}, anocutaneous {DESC; PRESENT/NOT PRESENT:21021351} ***bilaterally.  Speculum exam reveals normal vaginal mucosa {With/Without:20273} atrophy. Cervix {exam; gyn cervix:30847}. Uterus {exam; pelvic uterus:30849}. Adnexa {exam; adnexa:12223}.    s/p hysterectomy: Speculum exam reveals normal vaginal mucosa {With/Without:20273}  atrophy and normal vaginal cuff.  Adnexa {exam; adnexa:12223}.    With  apex supported, anterior compartment defect was {reduced:24765}  Pelvic floor strength {Roman # I-V:19040}/V, puborectalis {Roman # I-V:19040}/V external anal sphincter {Roman # I-V:19040}/V  Pelvic floor musculature: Right levator {Tender/Non-tender:20250}, Right obturator {Tender/Non-tender:20250}, Left levator {Tender/Non-tender:20250},  Left obturator {Tender/Non-tender:20250}  POP-Q:   POP-Q                                               Aa                                               Ba                                                 C                                                Gh                                               Pb                                               tvl                                                Ap                                               Bp                                                 D      Rectal Exam:  Normal sphincter tone, {rectocele:24766} distal rectocele, enterocoele {DESC; PRESENT/NOT PRESENT:21021351}, no rectal masses, {sign of:24767} dyssynergia when asking the patient to bear down.  Post-Void Residual (PVR) by Bladder Scan: In order to evaluate bladder emptying, we discussed obtaining a postvoid residual and she agreed to this procedure.  Procedure: The ultrasound unit was placed on the patient's abdomen in the suprapubic region after the patient had voided. A PVR of *** ml was obtained by bladder scan.  Laboratory Results: @ENCLABS @   ***I visualized the urine specimen, noting the specimen to be {urine color:24768}  ASSESSMENT AND PLAN [name] is a 77 y.o. with: No diagnosis found.    Selmer Dominion, NP   Medical Decision Making:  - Reviewed/ ordered a clinical laboratory test - Reviewed/ ordered a radiologic study - Reviewed/ ordered medicine test - Decision to obtain old records - Discussion of management of or test interpretation  with an external physician / other healthcare  professional  - Assessment requiring independent historian - Review and summation of prior records - Independent review of image, tracing or specimen

## 2023-06-04 ENCOUNTER — Encounter: Payer: Self-pay | Admitting: Obstetrics and Gynecology

## 2023-06-04 ENCOUNTER — Ambulatory Visit: Payer: Medicare HMO | Admitting: Obstetrics and Gynecology

## 2023-06-04 VITALS — BP 127/82 | HR 80 | Ht 62.0 in | Wt 138.0 lb

## 2023-06-04 DIAGNOSIS — N816 Rectocele: Secondary | ICD-10-CM

## 2023-06-04 DIAGNOSIS — R350 Frequency of micturition: Secondary | ICD-10-CM | POA: Diagnosis not present

## 2023-06-04 DIAGNOSIS — N8110 Cystocele, unspecified: Secondary | ICD-10-CM

## 2023-06-04 DIAGNOSIS — N952 Postmenopausal atrophic vaginitis: Secondary | ICD-10-CM

## 2023-06-04 DIAGNOSIS — N993 Prolapse of vaginal vault after hysterectomy: Secondary | ICD-10-CM

## 2023-06-04 LAB — POCT URINALYSIS DIPSTICK
Bilirubin, UA: NEGATIVE
Blood, UA: NEGATIVE
Glucose, UA: NEGATIVE
Ketones, UA: NEGATIVE
Nitrite, UA: NEGATIVE
Protein, UA: NEGATIVE
Spec Grav, UA: 1.025 (ref 1.010–1.025)
Urobilinogen, UA: 0.2 E.U./dL
pH, UA: 6 (ref 5.0–8.0)

## 2023-06-04 MED ORDER — ESTROGENS CONJUGATED 0.625 MG/GM VA CREA: 1.0000 | TOPICAL_CREAM | VAGINAL | 5 refills | Status: DC

## 2023-06-04 NOTE — Patient Instructions (Signed)
Since we had the pessary in today, it is hard to know what exact stage your prolapse is. We will need to re-examine this to determine options.   We discussed some surgical options and mesh vs. Non-mesh.   Please use the estrogen cream every night for 2 weeks and then twice a week after. You will put a large blueberry sized amount onto the finger and massage it into the vaginal walls for healing.   Bring your pessary to the appointment with Dr. Florian Buff.   If you decide to have a surgical procedure done, we would need to do a bladder test for leakage to potentially address this during prolapse repair.   Remember this is all about quality of life and we want you to fee supported.

## 2023-06-18 DIAGNOSIS — N765 Ulceration of vagina: Secondary | ICD-10-CM | POA: Diagnosis not present

## 2023-06-18 DIAGNOSIS — Z4689 Encounter for fitting and adjustment of other specified devices: Secondary | ICD-10-CM | POA: Diagnosis not present

## 2023-06-19 DIAGNOSIS — M272 Inflammatory conditions of jaws: Secondary | ICD-10-CM | POA: Diagnosis not present

## 2023-07-10 ENCOUNTER — Ambulatory Visit: Payer: Medicare HMO | Admitting: Obstetrics and Gynecology

## 2023-07-10 ENCOUNTER — Encounter: Payer: Self-pay | Admitting: Obstetrics and Gynecology

## 2023-07-10 VITALS — BP 138/82 | HR 79

## 2023-07-10 DIAGNOSIS — N8110 Cystocele, unspecified: Secondary | ICD-10-CM

## 2023-07-10 DIAGNOSIS — N993 Prolapse of vaginal vault after hysterectomy: Secondary | ICD-10-CM | POA: Diagnosis not present

## 2023-07-10 DIAGNOSIS — N816 Rectocele: Secondary | ICD-10-CM

## 2023-07-10 NOTE — Patient Instructions (Signed)
Use estrogen cream twice a week to prevent dry tissues  Vulvovaginal moisturizer Options: Vitamin E oil (pump or capsule) or cream (Gene's Vit E Cream) Coconut oil Silicone-based lubricant for use during intercourse ("wet platinum" is a brand available at most drugstores) Crisco Consider the ingredients of the product - the fewer the ingredients the better!  Directions for Use: Clean and dry your hands Gently dab the vulvar/vaginal area dry as needed Apply a "pea-sized" amount of the moisturizer onto your fingertip Using you other hand, open the labia  Apply the moisturizer to the vulvar/vaginal tissues Wear loose fitting underwear/clothing if possible following application Use moisturize up to 3 times daily as desired.

## 2023-07-10 NOTE — Progress Notes (Signed)
[facility] Return Visit  SUBJECTIVE  History of Present Illness: Sara Mason is a 77 y.o. female seen in follow-up for prolapse. She has a pessary and is maybe interested in surgery.   The pessary worked well for about 6 months. Has been causing some intermittent abrasions so it was removed and is using vaginal estrogen cream. She is not too bothered by the bulge, sometimes feels it with sitting. She is more concerned that the issue will worsen.   Past Medical History: Patient  has a past medical history of History of oral surgery and Osteomyelitis (HCC).   Past Surgical History: She  has a past surgical history that includes Abdominal hysterectomy.   Medications: She has a current medication list which includes the following prescription(s): conjugated estrogens.   Allergies: Patient is allergic to aspirin, ibuprofen, and short ragweed pollen ext.   Social History: Patient  reports that she has never smoked. She has never used smokeless tobacco. She reports that she does not currently use alcohol. She reports that she does not use drugs.      OBJECTIVE     Physical Exam: Vitals:   07/10/23 0922  BP: 138/82  Pulse: 79   Gen: No apparent distress, A&O x 3.  Detailed Urogynecologic Evaluation:  Normal external genitalia. On speculum, normal vaginal mucosa and no abrasions present.   POP-Q  1                                            Aa   1                                           Ba  -5                                              C   4.5                                            Gh  4                                            Pb  7                                            tvl   1                                            Ap  1  Bp                                                 D      ASSESSMENT AND PLAN    Ms. Molino is a 77 y.o. with:  1. Prolapse of anterior vaginal wall   2.  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 - She has concerns about surgery and risk of possible cognitive decline and we discussed this can happen regardless of type of anesthesia. But shorter procedures have less of a risk. Recommended colpocleisis vs A/P repair with SSLF. However, she prefers to not have surgery at this time.  - She will hold off on pessary for now as well per her preference.  - We discussed the option of pelvic PT as she is very active and may benefit from core/ pelvic floor strengthening to help prevent progression of the prolapse. Referral placed.  - Continue estrogen twice a week for atrophy. Can use vitamin E or coconut oil for moisture on other days.   Return as needed   Marguerita Beards, MD  Time spent: I spent 35 minutes dedicated to the care of this patient on the date of this encounter to include pre-visit review of records, face-to-face time with the patient and post visit documentation.

## 2023-09-03 DIAGNOSIS — M868X8 Other osteomyelitis, other site: Secondary | ICD-10-CM | POA: Diagnosis not present

## 2023-09-03 DIAGNOSIS — E785 Hyperlipidemia, unspecified: Secondary | ICD-10-CM | POA: Diagnosis not present

## 2023-09-03 DIAGNOSIS — R6884 Jaw pain: Secondary | ICD-10-CM | POA: Diagnosis not present

## 2023-09-03 DIAGNOSIS — R7301 Impaired fasting glucose: Secondary | ICD-10-CM | POA: Diagnosis not present

## 2023-09-04 ENCOUNTER — Encounter: Payer: Self-pay | Admitting: Physical Therapy

## 2023-09-04 ENCOUNTER — Other Ambulatory Visit: Payer: Self-pay

## 2023-09-04 ENCOUNTER — Encounter: Payer: Medicare HMO | Attending: Obstetrics and Gynecology | Admitting: Physical Therapy

## 2023-09-04 DIAGNOSIS — N8110 Cystocele, unspecified: Secondary | ICD-10-CM | POA: Diagnosis not present

## 2023-09-04 DIAGNOSIS — N816 Rectocele: Secondary | ICD-10-CM | POA: Diagnosis not present

## 2023-09-04 DIAGNOSIS — R278 Other lack of coordination: Secondary | ICD-10-CM | POA: Insufficient documentation

## 2023-09-04 DIAGNOSIS — M6281 Muscle weakness (generalized): Secondary | ICD-10-CM | POA: Insufficient documentation

## 2023-09-04 NOTE — Therapy (Signed)
OUTPATIENT PHYSICAL THERAPY FEMALE PELVIC EVALUATION   Patient Name: Sara Mason MRN: [id number] DOB:[birth year], 77 y.o., female Today's Date: 09/04/2023  END OF SESSION:  PT End of Session - 09/04/23 0942     Visit Number 1    Date for PT Re-Evaluation 10/30/23    Authorization Type [employer]    Authorization - Visit Number 1    Authorization - Number of Visits 10    PT Start Time 0930    PT Stop Time 1015    PT Time Calculation (min) 45 min    Activity Tolerance Patient tolerated treatment well    Behavior During Therapy WFL for tasks assessed/performed             Past Medical History:  Diagnosis Date   History of oral surgery    Osteomyelitis (HCC)    Past Surgical History:  Procedure Laterality Date   ABDOMINAL HYSTERECTOMY     There are no problems to display for this patient.   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 I have stopped the estrogen cream. Patient started to feel a bulge and see one. I have discomfort when I sit down. She can feel the bulge going up.  Fluid intake: Yes: water    PAIN:  Are you having pain? No  PRECAUTIONS: None  RED FLAGS: None   WEIGHT BEARING RESTRICTIONS: No  FALLS:  Has patient fallen in last 6 months? 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   PERTINENT HISTORY:  Abdominal hysterectomy  BOWEL MOVEMENT: Pain with bowel movement: No Type of bowel movement:Type (Bristol Stool Scale) Type 1-4, easy when Type 4, Frequency 1-2 days, Strain Yes, and Splinting tried but does not help Fully empty rectum: Yes:   Leakage: No Fiber supplement: No  URINATION: Pain with  urination: No Fully empty bladder: Yes:   Stream: Weak Urgency: Yes:   Frequency: night is 1 times; day void is 8 times Leakage: Walking to the bathroom just before she gets to the toilet Pads: Yes: sometimes if she has started leaking  INTERCOURSE: not active  PREGNANCY: none  PROLAPSE: Cystocele  , Urethrocele  , and Rectocele     OBJECTIVE:  Note: Objective measures were completed at Evaluation unless otherwise noted.  DIAGNOSTIC FINDINGS:  Pelvic floor strength I/V   PATIENT SURVEYS:  PFIQ-7 10 due to being frustrated and emotional stress, prolapse bothers her when sitting, feels it when going from standing to seated position  COGNITION: Overall cognitive status: Within functional limits for tasks assessed     SENSATION: Light touch: Appears intact Proprioception: Appears intact   GAIT: Assistive device utilized: None Level of assistance: Complete Independence Comments: when she walks she does not pick up her feet well and she is aware of this and she will shuffle her feet  POSTURE: rounded shoulders, forward head, decreased lumbar lordosis, and posterior pelvic tilt  PELVIC ALIGNMENT:  LUMBARAROM/PROM:  A/PROM A/PROM  eval  Flexion full  Extension Decreased by 50%  Right lateral flexion Decreased by 25%  Left lateral flexion Decreased by 25%  Right rotation Decreased by 25%  Left rotation Decreased by 25%   (Blank rows = not tested)  LOWER EXTREMITY ROM:  bilateral hip ROM is full   LOWER EXTREMITY MMT:  MMT Right eval Left eval  Hip extension 4/5 4/5  Hip abduction 4/5 4/5  Hip adduction 4/5 4/5   PALPATION:   General  Tightness of the diaphragm, able to contract the abdomen correctly                External Perineal Exam dryness                             Internal Pelvic Floor slight tightness in the right side of the introitus, red tissue at the urethra  Patient confirms identification and approves PT to assess internal pelvic floor and treatment Yes  PELVIC MMT:   MMT eval  Vaginal 3/5 5 sec 5 x; 6 quick flicks  (Blank rows = not tested)        TONE: good  PROLAPSE: Anterior wall and posterior wall weakness, saw the vaginal vault come to the introitus  TODAY'S TREATMENT:                                                                                                                              DATE: 09/04/23  EVAL See below   PATIENT EDUCATION:  09/04/23 Education details: Access Code: [email or web address] Person educated: Patient Education method: Programmer, multimedia, Demonstration, Actor cues, Verbal cues, and Handouts Education comprehension: verbalized understanding, returned demonstration, verbal cues required, tactile cues required, and needs further education  HOME EXERCISE PROGRAM: 09/04/23 Access Code: [email or web address] URL: [email or web address]  Date: 09/04/2023 Prepared by: Eulis Foster  Exercises - Supine Pelvic Floor Contraction  - 3 x daily - 7 x weekly - 1 sets - 10 reps - 5 sec hold  ASSESSMENT:  CLINICAL IMPRESSION: Patient is a 77 y.o. female who was seen today for physical therapy evaluation and treatment for vaginal prolapse.  Patient reports she started to feel a bulge and some discomfort in the vaginal area  year ago. She tried a pessary but caused discomfort. She was using premarin but stopped due to concerns of her family history of pancreatic cancer. Pelvic floor strength is 3/5 holding 5  seconds. She will leak urine when she is about to reach the commode. Patient will strain with a bowel movement when she has a type 1-2. Bilateral hip strength is 4/5. Patient bulge is noticeable when therapist is observing the introitus and it is at the entrance. Her anterior wall weakness is just above the introitus, posterior wall weakness is at the introitus and the vaginal vault comes to the introitus. Lumbar ROM is decreased by 25%. Patient will benefit from skilled therapy to improve pelvic floor strength and endurance and education on prolapse management.   OBJECTIVE IMPAIRMENTS: decreased endurance, decreased strength, increased fascial restrictions, and postural dysfunction.   ACTIVITY LIMITATIONS: sitting, continence, and toileting  PARTICIPATION LIMITATIONS: community activity  PERSONAL FACTORS: Time since onset of injury/illness/exacerbation and 1 comorbidity: Abdominal Hysterectomy  are also affecting patient's functional outcome.   REHAB POTENTIAL: Excellent  CLINICAL DECISION MAKING: Stable/uncomplicated  EVALUATION COMPLEXITY: Low   GOALS: Goals reviewed with patient? Yes  SHORT TERM GOALS: Target date: 10/02/23  Patient independent with initial HEP for diaphragmatic breathing, pelvic floor strength and hip strength Baseline: Goal status: INITIAL  2.  Patient educated on how to have a bowel movement to not strain and put pressure on the pelvic floor.  Baseline:  Goal status: INITIAL  3.   LONG TERM GOALS: Target date: 10/30/23  Patient is independent with advanced HEP for core and pelvic floor strength.  Baseline:  Goal status: INITIAL  2.  Patient is educated on correct pressure management with daily activities to reduce further progression of her prolapse.  Baseline:  Goal status: INITIAL  3.  Patient is educated on posture to reduce pressure on the prolapse keeping rib cage over the pelvis and keeping distance between rib cage and pubic bone.  Baseline:   Goal status: INITIAL  4.  Patient pelvic floor endurance increased so she is able to walk to the commode without leaking urine.  Baseline:  Goal status: INITIAL  5.  Patient educated on vaginal moisturizers to improve the health and reduce the dryness of the vaginal tissue.  Baseline:  Goal status: INITIAL   PLAN:  PT FREQUENCY: 1x/week  PT DURATION: 8 weeks  PLANNED INTERVENTIONS: Therapeutic exercises, Therapeutic activity, Neuromuscular re-education, Patient/Family education, Joint mobilization, Dry Needling, Biofeedback, and Manual therapy  PLAN FOR NEXT SESSION: supine pelvic floor strength, diaphragmatic breathing, posture   Eulis Foster, PT 09/04/23 11:14 AM

## 2023-09-13 ENCOUNTER — Encounter: Payer: Self-pay | Admitting: Physical Therapy

## 2023-09-13 ENCOUNTER — Encounter: Payer: Medicare HMO | Admitting: Physical Therapy

## 2023-09-13 DIAGNOSIS — R278 Other lack of coordination: Secondary | ICD-10-CM

## 2023-09-13 DIAGNOSIS — N8110 Cystocele, unspecified: Secondary | ICD-10-CM | POA: Diagnosis not present

## 2023-09-13 DIAGNOSIS — M6281 Muscle weakness (generalized): Secondary | ICD-10-CM | POA: Diagnosis not present

## 2023-09-13 DIAGNOSIS — N816 Rectocele: Secondary | ICD-10-CM | POA: Diagnosis not present

## 2023-09-13 NOTE — Therapy (Signed)
OUTPATIENT PHYSICAL THERAPY FEMALE PELVIC TREATMENT   Patient Name: Sara Mason MRN: [id number] DOB:09-Feb-1946, 77 y.o., female Today's Date: 09/13/2023  END OF SESSION:  PT End of Session - 09/13/23 1037     Visit Number 2    Date for PT Re-Evaluation 10/30/23    Authorization Type [employer]    Authorization Time Period 09/04/2023-10/30/2023    Authorization - Visit Number 2    Authorization - Number of Visits 8    Progress Note Due on Visit 10    PT Start Time 1030    PT Stop Time 1115    PT Time Calculation (min) 45 min    Activity Tolerance Patient tolerated treatment well    Behavior During Therapy WFL for tasks assessed/performed             Past Medical History:  Diagnosis Date   History of oral surgery    Osteomyelitis (HCC)    Past Surgical History:  Procedure Laterality Date   ABDOMINAL HYSTERECTOMY     There are no problems to display for this patient.   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 I notice the bulge when in the process of sitting down.    Fluid intake: Yes: water    PAIN:  Are you having pain? No  PRECAUTIONS: None  RED FLAGS: None   WEIGHT BEARING RESTRICTIONS: No  FALLS:  Has patient fallen in last 6 months? 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   PERTINENT HISTORY:  Abdominal hysterectomy  BOWEL MOVEMENT: Pain with bowel movement: No Type of bowel movement:Type (Bristol Stool Scale) Type 1-4, easy when Type 4, Frequency 1-2 days, Strain Yes, and Splinting tried but does not help Fully empty rectum: Yes:   Leakage: No Fiber supplement: No  URINATION: Pain with urination: No  Fully empty bladder: Yes:   Stream: Weak Urgency: Yes:   Frequency: night is 1 times; day void is 8 times Leakage: Walking to the bathroom just before she gets to the toilet Pads: Yes: sometimes if she has started leaking  INTERCOURSE: not active  PREGNANCY: none  PROLAPSE: Cystocele  , Urethrocele  , and Rectocele     OBJECTIVE:  Note: Objective measures were completed at Evaluation unless otherwise noted.  DIAGNOSTIC FINDINGS:  Pelvic floor strength I/V   PATIENT SURVEYS:  PFIQ-7 10 due to being frustrated and emotional stress, prolapse bothers her when sitting, feels it when going from standing to seated position  COGNITION: Overall cognitive status: Within functional limits for tasks assessed     SENSATION: Light touch: Appears intact Proprioception: Appears intact   GAIT: Assistive device utilized: None Level of assistance: Complete Independence Comments: when she walks she does not pick up her feet well and she is aware of this and she will shuffle her feet  POSTURE: rounded shoulders, forward head, decreased lumbar lordosis, and posterior pelvic tilt  PELVIC ALIGNMENT:  LUMBARAROM/PROM:  A/PROM A/PROM  eval  Flexion full  Extension Decreased by 50%  Right lateral flexion Decreased by 25%  Left lateral flexion Decreased by 25%  Right rotation Decreased by 25%  Left rotation Decreased by 25%   (Blank rows = not tested)  LOWER EXTREMITY ROM: bilateral hip ROM is  full   LOWER EXTREMITY MMT:  MMT Right eval Left eval  Hip extension 4/5 4/5  Hip abduction 4/5 4/5  Hip adduction 4/5 4/5   PALPATION:   General  Tightness of the diaphragm, able to contract the abdomen correctly                External Perineal Exam dryness                             Internal Pelvic Floor slight tightness in the right side of the introitus, red tissue at the urethra  Patient confirms identification and approves PT to assess internal pelvic floor and treatment Yes  PELVIC MMT:   MMT eval  Vaginal 3/5 5 sec 5 x; 6 quick flicks  (Blank rows = not tested)        TONE: good  PROLAPSE: Anterior wall and posterior wall weakness, saw the vaginal vault come to the introitus  TODAY'S TREATMENT:  09/13/23 Neuromuscular re-education: Down training: Diaphragmatic breathing with tactile cues to the lower rib cage then the pelvic floor to feel the pelvic floor drop Breathing out in a closed hole to generate pressure to facilitate a bowel movement while keeping the pelvic floor relaxed  Therapeutic activities: Functional strengthening activities: Education on how to sit on commode to have a bowel movement, keeping knees higher than the hips, knees apart, and correct breathing.  Education on posture with keeping the rib cage distance from the pubic bone to reduce pressure on the pelvic floor Education on lifting with keeping the distance from the rib cage to the pubic bone, flexing at the hips and back extended Education on exercise at the gym with breathing out on the hardest part, distance from the rib cage to the pubic bone, flexing at the hips and back extended with squats, lunges, dead lifts and bicep curls  DATE: 09/04/23  EVAL See below   PATIENT EDUCATION:  09/13/23 Education details: Access Code: [email or web address], diaphragmatic breathing,  how to have a bowel movement, body mechanics with lifting and exercise Person educated: Patient Education method: Explanation, Demonstration, Tactile cues, Verbal cues, and Handouts Education comprehension: verbalized understanding, returned demonstration, verbal cues required, tactile cues required, and needs further education  HOME EXERCISE PROGRAM: 09/04/23 Access Code: [email or web address] URL: [email or web address] Date: 09/04/2023 Prepared by: Eulis Foster  Exercises - Supine Pelvic Floor Contraction  - 3 x daily - 7 x weekly - 1 sets - 10 reps - 5 sec hold  ASSESSMENT:  CLINICAL IMPRESSION: Patient is a 77 y.o. female who was seen today for physical therapy  treatment for vaginal prolapse.   Patient is able to perform diaphragmatic breathing with pelvic drop and feel it happen. She was able to return demonstration of having a bowel movement with correct posture and breathing, She understands to keep the distance between the rib cage and pubic bone. Patient will benefit from skilled therapy to improve pelvic floor strength and endurance and education on prolapse management.   OBJECTIVE IMPAIRMENTS: decreased endurance, decreased strength, increased fascial restrictions, and postural dysfunction.   ACTIVITY LIMITATIONS: sitting, continence, and toileting  PARTICIPATION LIMITATIONS: community activity  PERSONAL FACTORS: Time since onset of injury/illness/exacerbation and 1 comorbidity: Abdominal Hysterectomy  are also affecting patient's functional outcome.   REHAB POTENTIAL: Excellent  CLINICAL DECISION MAKING: Stable/uncomplicated  EVALUATION COMPLEXITY: Low   GOALS: Goals reviewed with patient? Yes  SHORT TERM GOALS: Target date: 10/02/23  Patient independent with initial HEP for diaphragmatic breathing, pelvic floor strength and hip strength Baseline: Goal status: INITIAL  2.  Patient educated on how to have a bowel movement to not strain and put pressure on the  pelvic floor.  Baseline:  Goal status: Met 09/13/23  3.   LONG TERM GOALS: Target date: 10/30/23  Patient is independent with advanced HEP for core and pelvic floor strength.  Baseline:  Goal status: INITIAL  2.  Patient is educated on correct pressure management with daily activities to reduce further progression of her prolapse.  Baseline:  Goal status: INITIAL  3.  Patient is educated on posture to reduce pressure on the prolapse keeping rib cage over the pelvis and keeping distance between rib cage and pubic bone.  Baseline:  Goal status: INITIAL  4.  Patient pelvic floor endurance increased so she is able to walk to the commode without leaking urine.  Baseline:  Goal status: INITIAL  5.  Patient educated on vaginal moisturizers to improve the health and reduce the dryness of the vaginal tissue.  Baseline:  Goal status: INITIAL   PLAN:  PT FREQUENCY: 1x/week  PT DURATION: 8 weeks  PLANNED INTERVENTIONS: Therapeutic exercises, Therapeutic activity, Neuromuscular re-education, Patient/Family education, Joint mobilization, Dry Needling, Biofeedback, and Manual therapy  PLAN FOR NEXT SESSION: supine pelvic floor strength and hip strength  Eulis Foster, PT 09/13/23 11:31 AM

## 2023-09-14 ENCOUNTER — Encounter: Payer: Self-pay | Admitting: Neurology

## 2023-09-20 ENCOUNTER — Encounter: Payer: Medicare HMO | Admitting: Physical Therapy

## 2023-09-20 ENCOUNTER — Encounter: Payer: Self-pay | Admitting: Physical Therapy

## 2023-09-20 DIAGNOSIS — M6281 Muscle weakness (generalized): Secondary | ICD-10-CM

## 2023-09-20 DIAGNOSIS — N8110 Cystocele, unspecified: Secondary | ICD-10-CM | POA: Diagnosis not present

## 2023-09-20 DIAGNOSIS — R278 Other lack of coordination: Secondary | ICD-10-CM | POA: Diagnosis not present

## 2023-09-20 DIAGNOSIS — N816 Rectocele: Secondary | ICD-10-CM | POA: Diagnosis not present

## 2023-09-20 NOTE — Therapy (Signed)
OUTPATIENT PHYSICAL THERAPY FEMALE PELVIC TREATMENT   Patient Name: Sara Mason MRN: [id number] DOB:29-Sep-1946, 77 y.o., female Today's Date: 09/20/2023  END OF SESSION:  PT End of Session - 09/20/23 1031     Visit Number 3    Date for PT Re-Evaluation 10/30/23    Authorization Type [employer]    Authorization Time Period 09/04/2023-10/30/2023    Authorization - Visit Number 3    Authorization - Number of Visits 8    Progress Note Due on Visit 10    PT Start Time 1030    PT Stop Time 1115    PT Time Calculation (min) 45 min    Activity Tolerance Patient tolerated treatment well    Behavior During Therapy WFL for tasks assessed/performed             Past Medical History:  Diagnosis Date   History of oral surgery    Osteomyelitis (HCC)    Past Surgical History:  Procedure Laterality Date   ABDOMINAL HYSTERECTOMY     There are no problems to display for this patient.   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 I will strain a little. Sometimes I will hold my breath and have to start over again. The bulge is the same but at times it feels more.   PAIN:  Are you having pain? No  PRECAUTIONS: None  RED FLAGS: None   WEIGHT BEARING RESTRICTIONS: No  FALLS:  Has patient fallen in last 6 months? 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   PERTINENT HISTORY:  Abdominal hysterectomy  BOWEL MOVEMENT: Pain with bowel movement: No Type of bowel movement:Type (Bristol Stool Scale) Type 1-4, easy when Type 4, Frequency 1-2 days, Strain Yes, and Splinting tried but does not help Fully empty rectum: Yes:   Leakage: No Fiber supplement:  No  URINATION: Pain with urination: No Fully empty bladder: Yes:   Stream: Weak Urgency: Yes:   Frequency: night is 1 times; day void is 8 times Leakage: Walking to the bathroom just before she gets to the toilet Pads: Yes: sometimes if she has started leaking  INTERCOURSE: not active  PREGNANCY: none  PROLAPSE: Cystocele  , Urethrocele  , and Rectocele     OBJECTIVE:  Note: Objective measures were completed at Evaluation unless otherwise noted.  DIAGNOSTIC FINDINGS:  Pelvic floor strength I/V   PATIENT SURVEYS:  PFIQ-7 10 due to being frustrated and emotional stress, prolapse bothers her when sitting, feels it when going from standing to seated position  COGNITION: Overall cognitive status: Within functional limits for tasks assessed     SENSATION: Light touch: Appears intact Proprioception: Appears intact   GAIT: Assistive device utilized: None Level of assistance: Complete Independence Comments: when she walks she does not pick up her feet well and she is aware of this and she will shuffle her feet  POSTURE: rounded shoulders, forward head, decreased lumbar lordosis, and posterior pelvic tilt  PELVIC ALIGNMENT:  LUMBARAROM/PROM:  A/PROM A/PROM  eval  Flexion full  Extension Decreased by 50%  Right lateral flexion Decreased by 25%  Left lateral flexion Decreased by 25%  Right rotation Decreased by 25%  Left rotation Decreased by 25%   (Blank rows = not  tested)  LOWER EXTREMITY ROM: bilateral hip ROM is full   LOWER EXTREMITY MMT:  MMT Right eval Left eval  Hip extension 4/5 4/5  Hip abduction 4/5 4/5  Hip adduction 4/5 4/5   PALPATION:   General  Tightness of the diaphragm, able to contract the abdomen correctly                External Perineal Exam dryness                             Internal Pelvic Floor slight tightness in the right side of the introitus, red tissue at the urethra  Patient confirms identification and approves PT to assess internal pelvic floor and treatment Yes  PELVIC MMT:   MMT eval  Vaginal 3/5 5 sec 5 x; 6 quick flicks  (Blank rows = not tested)        TONE: good  PROLAPSE: Anterior wall and posterior wall weakness, saw the vaginal vault come to the introitus  TODAY'S TREATMENT:  09/20/23 Exercises: Strengthening: (core and pelvic floor engaged with exercises) Transverse abdominus contraction with pelvic floor 10 x to warm up Supine marching with red band around knees and bringing ball overhead 20 x  Supine hip abduction with red band and bringing yoga block overhead 20 x Supine bridge with yoga block squeeze while ER of shoulders against the red band 20 x  Bear Plank hold 5 sec 10 x Therapeutic activities: Functional strengthening activities: Educated patient on what prolapses are and how they progress Sitting on commode with heels up and feeling the tension increase compared to heels down to relax the pelvic floor and not strain Education on using the vitamin E oil on the vulva tissue at night to work on the health of the tissue and reduce the dryness    09/13/23 Neuromuscular re-education: Down training: Diaphragmatic breathing with tactile cues to the lower rib cage then the pelvic floor to feel the pelvic floor drop Breathing out in a closed hole to generate pressure to facilitate a  bowel movement while keeping the pelvic floor relaxed  Therapeutic activities: Functional  strengthening activities: Education on how to sit on commode to have a bowel movement, keeping knees higher than the hips, knees apart, and correct breathing.  Education on posture with keeping the rib cage distance from the pubic bone to reduce pressure on the pelvic floor Education on lifting with keeping the distance from the rib cage to the pubic bone, flexing at the hips and back extended Education on exercise at the gym with breathing out on the hardest part, distance from the rib cage to the pubic bone, flexing at the hips and back extended with squats, lunges, dead lifts and bicep curls                                                                                                                                PATIENT EDUCATION:  09/20/23 Education details: Access Code: [email or web address], education on the different prolapses, reviewed toileting, dicussed using vitamin E oil for vulvar moisture Person educated: Patient Education method: Explanation, Demonstration, Tactile cues, Verbal cues, and Handouts Education comprehension: verbalized understanding, returned demonstration, verbal cues required, tactile cues required, and needs further education  HOME EXERCISE PROGRAM: 09/20/23 Access Code: [email or web address] URL: [email or web address] Date: 09/20/2023 Prepared by: Eulis Foster  Exercises - Supine Pelvic Floor Contraction  - 3 x daily - 7 x weekly - 1 sets - 10 reps - 5 sec hold - Supine March with Resistance Band  - 1 x daily - 3 x weekly - 2 sets - 10 reps - Pelvic Floor Contractions in Hooklying with Resisted Abduction  - 1 x daily - 3 x weekly - 1 sets - 15 reps - Supine Bridge with Mini Swiss Ball Between Knees  - 1 x daily - 3 x weekly - 1 sets - 50 reps - Bear Plank from [employer]  - 1 x daily - 3 x weekly - 1 sets - 10 reps - Supine Pelvic Floor Contraction  - 3 x daily - 7 x weekly - 1 sets - 10 reps - 5 sec hold  ASSESSMENT:  CLINICAL IMPRESSION: Patient is a 77 y.o.  female who was seen today for physical therapy  treatment for vaginal prolapse.   Patient has not leaked urine while walking to the bathroom this week.  Patient did not feel a bulge while she exercised in therapy. She understands how to use vitamin E for vulvar moisture. She understands the different prolapses she has. Patient will benefit from skilled therapy to improve pelvic floor strength and endurance and education on prolapse management.   OBJECTIVE IMPAIRMENTS: decreased endurance, decreased strength, increased fascial restrictions, and postural dysfunction.   ACTIVITY LIMITATIONS: sitting, continence, and toileting  PARTICIPATION LIMITATIONS: community activity  PERSONAL FACTORS: Time since onset of injury/illness/exacerbation and 1 comorbidity: Abdominal Hysterectomy  are also affecting patient's functional outcome.   REHAB POTENTIAL: Excellent  CLINICAL DECISION MAKING: Stable/uncomplicated  EVALUATION COMPLEXITY: Low  GOALS: Goals reviewed with patient? Yes  SHORT TERM GOALS: Target date: 10/02/23  Patient independent with initial HEP for diaphragmatic breathing, pelvic floor strength and hip strength Baseline: Goal status: Met 09/20/23  2.  Patient educated on how to have a bowel movement to not strain and put pressure on the pelvic floor.  Baseline:  Goal status: Met 09/13/23  3.   LONG TERM GOALS: Target date: 10/30/23  Patient is independent with advanced HEP for core and pelvic floor strength.  Baseline:  Goal status: INITIAL  2.  Patient is educated on correct pressure management with daily activities to reduce further progression of her prolapse.  Baseline:  Goal status: INITIAL  3.  Patient is educated on posture to reduce pressure on the prolapse keeping rib cage over the pelvis and keeping distance between rib cage and pubic bone.  Baseline:  Goal status: INITIAL  4.  Patient pelvic floor endurance increased so she is able to walk to the commode  without leaking urine.  Baseline: not this week Goal status: progression 09/20/23  5.  Patient educated on vaginal moisturizers to improve the health and reduce the dryness of the vaginal tissue.  Baseline:  Goal status: Met 09/20/23   PLAN:  PT FREQUENCY: 1x/week  PT DURATION: 8 weeks  PLANNED INTERVENTIONS: Therapeutic exercises, Therapeutic activity, Neuromuscular re-education, Patient/Family education, Joint mobilization, Dry Needling, Biofeedback, and Manual therapy  PLAN FOR NEXT SESSION: advance HEP, see how toileting is, check on using the vitamin E  Eulis Foster, PT 09/20/23 11:27 AM

## 2023-09-25 DIAGNOSIS — H2513 Age-related nuclear cataract, bilateral: Secondary | ICD-10-CM | POA: Diagnosis not present

## 2023-09-25 DIAGNOSIS — H52203 Unspecified astigmatism, bilateral: Secondary | ICD-10-CM | POA: Diagnosis not present

## 2023-09-25 DIAGNOSIS — H35073 Retinal telangiectasis, bilateral: Secondary | ICD-10-CM | POA: Diagnosis not present

## 2023-09-27 ENCOUNTER — Encounter: Payer: Medicare HMO | Admitting: Physical Therapy

## 2023-09-27 ENCOUNTER — Encounter: Payer: Self-pay | Admitting: Physical Therapy

## 2023-09-27 DIAGNOSIS — M6281 Muscle weakness (generalized): Secondary | ICD-10-CM

## 2023-09-27 DIAGNOSIS — N816 Rectocele: Secondary | ICD-10-CM | POA: Diagnosis not present

## 2023-09-27 DIAGNOSIS — R278 Other lack of coordination: Secondary | ICD-10-CM | POA: Diagnosis not present

## 2023-09-27 DIAGNOSIS — N8110 Cystocele, unspecified: Secondary | ICD-10-CM | POA: Diagnosis not present

## 2023-09-27 NOTE — Therapy (Signed)
OUTPATIENT PHYSICAL THERAPY FEMALE PELVIC TREATMENT   Patient Name: Sara Mason MRN: [id number] DOB:December 23, 1945, 77 y.o., female Today's Date: 09/27/2023  END OF SESSION:  PT End of Session - 09/27/23 1037     Visit Number 4    Date for PT Re-Evaluation 10/30/23    Authorization Type [employer]    Authorization Time Period 09/04/2023-10/30/2023    Authorization - Visit Number 4    Authorization - Number of Visits 8    Progress Note Due on Visit 10    PT Start Time 1030    PT Stop Time 1115    PT Time Calculation (min) 45 min    Activity Tolerance Patient tolerated treatment well    Behavior During Therapy WFL for tasks assessed/performed             Past Medical History:  Diagnosis Date   History of oral surgery    Osteomyelitis (HCC)    Past Surgical History:  Procedure Laterality Date   ABDOMINAL HYSTERECTOMY     There are no problems to display for this patient.   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 I feel the pressure more when getting low to exercise or sitting on a bench.    PAIN:  Are you having pain? No  PRECAUTIONS: None  RED FLAGS: None   WEIGHT BEARING RESTRICTIONS: No  FALLS:  Has patient fallen in last 6 months? 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   PERTINENT HISTORY:  Abdominal hysterectomy  BOWEL MOVEMENT: Pain with bowel movement: No Type of bowel movement:Type (Bristol Stool Scale) Type 1-4, easy when Type 4, Frequency 1-2 days, Strain Yes, and Splinting tried but does not help Fully empty rectum: Yes:   Leakage: No Fiber supplement: No  URINATION: Pain with  urination: No Fully empty bladder: Yes:   Stream: Weak Urgency: Yes:   Frequency: night is 1 times; day void is 8 times Leakage: Walking to the bathroom just before she gets to the toilet Pads: Yes: sometimes if she has started leaking  INTERCOURSE: not active  PREGNANCY: none  PROLAPSE: Cystocele  , Urethrocele  , and Rectocele     OBJECTIVE:  Note: Objective measures were completed at Evaluation unless otherwise noted.  DIAGNOSTIC FINDINGS:  Pelvic floor strength I/V   PATIENT SURVEYS:  PFIQ-7 10 due to being frustrated and emotional stress, prolapse bothers her when sitting, feels it when going from standing to seated position  COGNITION: Overall cognitive status: Within functional limits for tasks assessed     SENSATION: Light touch: Appears intact Proprioception: Appears intact   GAIT: Assistive device utilized: None Level of assistance: Complete Independence Comments: when she walks she does not pick up her feet well and she is aware of this and she will shuffle her feet  POSTURE: rounded shoulders, forward head, decreased lumbar lordosis, and posterior pelvic tilt  PELVIC ALIGNMENT:  LUMBARAROM/PROM:  A/PROM A/PROM  eval  Flexion full  Extension Decreased by 50%  Right lateral flexion Decreased by 25%  Left lateral flexion Decreased by 25%  Right rotation Decreased by 25%  Left rotation Decreased by 25%   (Blank rows = not tested)  LOWER EXTREMITY ROM:  bilateral hip ROM is full   LOWER EXTREMITY MMT:  MMT Right eval Left eval  Hip extension 4/5 4/5  Hip abduction 4/5 4/5  Hip adduction 4/5 4/5   PALPATION:   General  Tightness of the diaphragm, able to contract the abdomen correctly                External Perineal Exam dryness                             Internal Pelvic Floor slight tightness in the right side of the introitus, red tissue at the urethra  Patient confirms identification and approves PT to assess internal pelvic floor and treatment Yes  PELVIC MMT:   MMT eval  Vaginal 3/5 5 sec 5 x; 6 quick flicks  (Blank rows = not tested)        TONE: good  PROLAPSE: Anterior wall and posterior wall weakness, saw the vaginal vault come to the introitus  TODAY'S TREATMENT:  09/27/23 Exercises: Strengthening: Bridge with ball between knees 15 x Bridge with red band around the wrists bringing arms overhead 15 x  Marching with hips elevated in supine with red band around thighs lifting yoga block overhead 15 x  Bear plank with yoga block between knees holding 5 sec 15 x  Standing star pose to open hips Standing leaning on counter with hip ER Therapeutic activities: Functional strengthening activities: Educated patient on pelvic organ prolapse support underwear to use  Going from standing to supine on mat with breathing, going to her side and not coming straight up putting pressure on pelvic floor, understanding going from supine to sidely to quadruped to half kneel to standing and back.   09/20/23 Exercises: Strengthening: (core and pelvic floor engaged with exercises) Transverse abdominus contraction with pelvic floor 10 x to warm up Supine marching with red band around knees and bringing ball overhead 20 x  Supine hip abduction with red band and bringing yoga block overhead 20 x Supine bridge with yoga block squeeze while ER of  shoulders against the red band 20 x  Bear Plank hold 5 sec 10 x Therapeutic  activities: Functional strengthening activities: Educated patient on what prolapses are and how they progress Sitting on commode with heels up and feeling the tension increase compared to heels down to relax the pelvic floor and not strain Education on using the vitamin E oil on the vulva tissue at night to work on the health of the tissue and reduce the dryness    09/13/23 Neuromuscular re-education: Down training: Diaphragmatic breathing with tactile cues to the lower rib cage then the pelvic floor to feel the pelvic floor drop Breathing out in a closed hole to generate pressure to facilitate a bowel movement while keeping the pelvic floor relaxed  Therapeutic activities: Functional strengthening activities: Education on how to sit on commode to have a bowel movement, keeping knees higher than the hips, knees apart, and correct breathing.  Education on posture with keeping the rib cage distance from the pubic bone to reduce pressure on the pelvic floor Education on lifting with keeping the distance from the rib cage to the pubic bone, flexing at the hips and back extended Education on exercise at the gym with breathing out on the hardest part, distance from the rib cage to the pubic bone, flexing at the hips and back extended with squats, lunges, dead lifts and bicep curls                                                                                                                                PATIENT EDUCATION:  09/27/23 Education details: Access Code: [email or web address], how to get up and down from floor with less pressure on the prolapse, prolapse support underwear Person educated: Patient Education method: Explanation, Demonstration, Tactile cues, Verbal cues, and Handouts Education comprehension: verbalized understanding, returned demonstration, verbal cues required, tactile cues required, and needs further education  HOME EXERCISE PROGRAM: 09/27/23 Access Code: [email or web address] URL:  [email or web address] Date: 09/27/2023 Prepared by: Eulis Foster  Exercises - Supine Pelvic Floor Contraction  - 3 x daily - 7 x weekly - 1 sets - 10 reps - 5 sec hold - Supine March with Resistance Band  - 1 x daily - 3 x weekly - 2 sets - 10 reps - Pelvic Floor Contractions in Hooklying with Resisted Abduction  - 1 x daily - 3 x weekly - 1 sets - 15 reps - Supine Bridge with Mini Swiss Ball Between Knees  - 1 x daily - 3 x weekly - 1 sets - 15 reps - Bear Plank from [employer]  - 1 x daily - 3 x weekly - 1 sets - 10 reps - 5 sec hold - Standing Hip External Rotation at Table  - 1 x daily - 3 x weekly - 1 sets - 5 reps - Leaning Diagonal Hip Extension and External Rotation  - 1 x daily - 3 x weekly - 1 sets - 5 reps  ASSESSMENT:  CLINICAL IMPRESSION: Patient is a 77 y.o. female who was seen today for physical therapy  treatment for vaginal prolapse.   Patient has  leaked urine while walking to the bathroom this week 1 time this week. She has difficulty getting up and down from the floor and feels the pressure the most. Therapist instructed patient on ways to get up and down from the floor with reduction of pressure on the pelvic floor.  She is learning how to open her hips to make it easier to get off her hourse and reduce pressue on the pelvic floor.  Patient will benefit from skilled therapy to improve pelvic floor strength and endurance and education on prolapse management.   OBJECTIVE IMPAIRMENTS: decreased endurance, decreased strength, increased fascial restrictions, and postural dysfunction.   ACTIVITY LIMITATIONS: sitting, continence, and toileting  PARTICIPATION LIMITATIONS: community activity  PERSONAL FACTORS: Time since onset of injury/illness/exacerbation and 1 comorbidity: Abdominal Hysterectomy  are also affecting patient's functional outcome.   REHAB POTENTIAL: Excellent  CLINICAL DECISION MAKING: Stable/uncomplicated  EVALUATION COMPLEXITY:  Low   GOALS: Goals reviewed with patient? Yes  SHORT TERM GOALS: Target date: 10/02/23  Patient independent with initial HEP for diaphragmatic breathing, pelvic floor strength and hip strength Baseline: Goal status: Met 09/20/23  2.  Patient educated on how to have a bowel movement to not strain and put pressure on the pelvic floor.  Baseline:  Goal status: Met 09/13/23  3.   LONG TERM GOALS: Target date: 10/30/23  Patient is independent with advanced HEP for core and pelvic floor strength.  Baseline:  Goal status: INITIAL  2.  Patient is educated on correct pressure management with daily activities to reduce further progression of her prolapse.  Baseline:  Goal status: INITIAL  3.  Patient is educated on posture to reduce pressure on the prolapse keeping rib cage over the pelvis and keeping distance between rib cage and pubic bone.  Baseline:  Goal status: Met 09/27/23  4.  Patient pelvic floor endurance increased so she is able to walk to the commode without leaking urine.  Baseline: not this week Goal status: progression 09/20/23  5.  Patient educated on vaginal moisturizers to improve the health and reduce the dryness of the vaginal tissue.  Baseline:  Goal status: Met 09/20/23   PLAN:  PT FREQUENCY: 1x/week  PT DURATION: 8 weeks  PLANNED INTERVENTIONS: Therapeutic exercises, Therapeutic activity, Neuromuscular re-education, Patient/Family education, Joint mobilization, Dry Needling, Biofeedback, and Manual therapy  PLAN FOR NEXT SESSION: advance HEP, see how it is to get off the floor and riding her horse, possible discharge if doing well  Eulis Foster, PT 09/27/23 11:26 AM

## 2023-10-04 ENCOUNTER — Encounter: Payer: Self-pay | Admitting: Physical Therapy

## 2023-10-04 ENCOUNTER — Encounter: Payer: Medicare HMO | Admitting: Physical Therapy

## 2023-10-04 DIAGNOSIS — M6281 Muscle weakness (generalized): Secondary | ICD-10-CM | POA: Diagnosis not present

## 2023-10-04 DIAGNOSIS — R278 Other lack of coordination: Secondary | ICD-10-CM

## 2023-10-04 DIAGNOSIS — N8110 Cystocele, unspecified: Secondary | ICD-10-CM | POA: Diagnosis not present

## 2023-10-04 DIAGNOSIS — N816 Rectocele: Secondary | ICD-10-CM | POA: Diagnosis not present

## 2023-10-04 NOTE — Therapy (Signed)
OUTPATIENT PHYSICAL THERAPY FEMALE PELVIC TREATMENT   Patient Name: LANIJA LOMIBAO MRN: [id number] DOB:01/29/1946, 77 y.o., female Today's Date: 10/04/2023  END OF SESSION:  PT End of Session - 10/04/23 1033     Visit Number 5    Date for PT Re-Evaluation 10/30/23    Authorization Type [employer]    Authorization Time Period 09/04/2023-10/30/2023    Authorization - Visit Number 5    Authorization - Number of Visits 8    Progress Note Due on Visit 10    PT Start Time 1030    PT Stop Time 1115    PT Time Calculation (min) 45 min    Activity Tolerance Patient tolerated treatment well    Behavior During Therapy WFL for tasks assessed/performed             Past Medical History:  Diagnosis Date   History of oral surgery    Osteomyelitis (HCC)    Past Surgical History:  Procedure Laterality Date   ABDOMINAL HYSTERECTOMY     There are no problems to display for this patient.   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 I feel the pressure more when getting low to exercise or sitting on a bench.    PAIN:  Are you having pain? No  PRECAUTIONS: None  RED FLAGS: None   WEIGHT BEARING RESTRICTIONS: No  FALLS:  Has patient fallen in last 6 months? 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   PERTINENT HISTORY:  Abdominal hysterectomy  BOWEL MOVEMENT: Pain with bowel movement: No Type of bowel movement:Type (Bristol Stool Scale) Type 1-4, easy when Type 4, Frequency 1-2 days, Strain Yes, and Splinting tried but does not help Fully empty rectum: Yes:   Leakage: No Fiber supplement: No  URINATION: Pain with  urination: No Fully empty bladder: Yes:   Stream: Weak Urgency: Yes:   Frequency: night is 1 times; day void is 8 times Leakage: Walking to the bathroom just before she gets to the toilet Pads: Yes: sometimes if she has started leaking  INTERCOURSE: not active  PREGNANCY: none  PROLAPSE: Cystocele  , Urethrocele  , and Rectocele     OBJECTIVE:  Note: Objective measures were completed at Evaluation unless otherwise noted.  DIAGNOSTIC FINDINGS:  Pelvic floor strength I/V   PATIENT SURVEYS:  PFIQ-7 10 due to being frustrated and emotional stress, prolapse bothers her when sitting, feels it when going from standing to seated position  COGNITION: Overall cognitive status: Within functional limits for tasks assessed     SENSATION: Light touch: Appears intact Proprioception: Appears intact   GAIT: Assistive device utilized: None Level of assistance: Complete Independence Comments: when she walks she does not pick up her feet well and she is aware of this and she will shuffle her feet  POSTURE: rounded shoulders, forward head, decreased lumbar lordosis, and posterior pelvic tilt  PELVIC ALIGNMENT:  LUMBARAROM/PROM:  A/PROM A/PROM  eval  Flexion full  Extension Decreased by 50%  Right lateral flexion Decreased by 25%  Left lateral flexion Decreased by 25%  Right rotation Decreased by 25%  Left rotation Decreased by 25%   (Blank rows = not tested)  LOWER EXTREMITY ROM:  bilateral hip ROM is full   LOWER EXTREMITY MMT:  MMT Right eval Left eval Right/left 10/04/23  Hip extension 4/5 4/5 4/5  Hip abduction 4/5 4/5 5/5  Hip adduction 4/5 4/5 5/5   PALPATION:   General  Tightness of the diaphragm, able to contract the abdomen correctly                External Perineal Exam dryness                             Internal Pelvic Floor slight tightness in the right side of the introitus, red tissue at the urethra  Patient confirms identification and approves PT to assess internal pelvic floor and treatment Yes  PELVIC MMT:   MMT eval  Vaginal 3/5 5 sec 5 x; 6 quick flicks  (Blank rows = not tested)        TONE: good  PROLAPSE: Anterior wall and posterior wall weakness, saw the vaginal vault come to the introitus  TODAY'S TREATMENT:  10/04/23 Exercises: Stretches/mobility: Marjo Bicker pose holding 30 sec Strengthening: Standing star pose to open hips 10 x each way Supine pelvic floor contraction 10 x hokding 10 sec Marching with hips elevated in supine with red band around thighs lifting yoga block overhead 15 x  Bridge with red band around the wrists bringing arms overhead 15 x  Pelvic Floor Contractions in Hooklying with Resisted Abduction  Therapeutic activities: Functional strengthening activities: Reviewed with patient on prolapse management and pressure management    09/27/23 Exercises: Strengthening: Bridge with ball between knees 15 x Bridge with red band around the wrists bringing arms overhead 15 x  Marching with hips elevated in supine with red band around thighs lifting yoga block overhead 15 x  Bear plank with yoga block between knees holding 5 sec 15 x  Standing star pose to open hips Standing leaning on counter with hip ER Therapeutic activities: Functional strengthening activities: Educated patient on pelvic organ prolapse support underwear to use  Going from standing to supine on mat  with breathing, going to her side  and not coming straight up putting pressure on pelvic floor, understanding going from supine to sidely to quadruped to half kneel to standing and back.    PATIENT EDUCATION:  09/27/23 Education details: Access Code: [email or web address], how to get up and down from floor with less pressure on the prolapse, prolapse support underwear Person educated: Patient Education method: Explanation, Demonstration, Tactile cues, Verbal cues, and Handouts Education comprehension: verbalized understanding, returned demonstration, verbal cues required, tactile cues required, and needs further education  HOME EXERCISE PROGRAM: 09/27/23 Access Code: [email or web address] URL: [email or web address] Date: 09/27/2023 Prepared by: Eulis Foster  Exercises - Supine Pelvic Floor Contraction  - 3 x daily - 7 x weekly - 1 sets - 10 reps - 5 sec hold - Supine March with Resistance Band  - 1 x daily - 3 x weekly - 2 sets - 10 reps - Pelvic Floor Contractions in Hooklying with Resisted Abduction  - 1 x daily - 3 x weekly - 1 sets - 15 reps - Supine Bridge with Mini Swiss Ball Between Knees  - 1 x daily - 3 x weekly - 1 sets - 15 reps - Bear Plank from [employer]  - 1 x daily - 3 x weekly - 1 sets - 10 reps - 5 sec hold - Standing Hip External Rotation at Table  - 1 x daily - 3 x weekly - 1 sets - 5 reps - Leaning Diagonal Hip Extension and External Rotation  - 1 x daily - 3 x weekly - 1 sets - 5 reps  ASSESSMENT:  CLINICAL IMPRESSION: Patient is a 77 y.o. female who was seen today for physical therapy  treatment for vaginal prolapse.  Patient understands prolapse management and pressure management to reduce strain on the pelvic floor. She feels the prolapse has not changed. She is independent with her HEP. She understands how to have a bowel movement without straining. She is able to get up and down from the floor without feeling the increased pressure vaginally. She may have a drop of urine 1 time a week when walking to the  commode. Patient understands how to workout at the gym without putting strain on the pelvic floor. Patient is ready for discharge.   OBJECTIVE IMPAIRMENTS: decreased endurance, decreased strength, increased fascial restrictions, and postural dysfunction.   ACTIVITY LIMITATIONS: sitting, continence, and toileting  PARTICIPATION LIMITATIONS: community activity  PERSONAL FACTORS: Time since onset of injury/illness/exacerbation and 1 comorbidity: Abdominal Hysterectomy  are also affecting patient's functional outcome.   REHAB POTENTIAL: Excellent  CLINICAL DECISION MAKING: Stable/uncomplicated  EVALUATION COMPLEXITY: Low   GOALS: Goals reviewed with patient? Yes  SHORT TERM GOALS: Target date: 10/02/23  Patient independent with initial HEP for diaphragmatic breathing, pelvic floor strength and hip strength Baseline: Goal status: Met 09/20/23  2.  Patient educated on how to have a bowel movement to not strain and put pressure on the pelvic floor.  Baseline:  Goal status: Met 09/13/23  3.   LONG TERM GOALS: Target date: 10/30/23  Patient is independent with advanced HEP for core and pelvic floor strength.  Baseline:  Goal status: Met 10/04/23  2.  Patient is educated on correct pressure management with daily activities to reduce further progression of her prolapse.  Baseline:  Goal status: Met 10/04/23  3.  Patient is educated on posture to reduce pressure on the prolapse keeping rib cage over the pelvis and keeping distance between rib cage and pubic bone.  Baseline:  Goal  status: Met 09/27/23  4.  Patient pelvic floor endurance increased so she is able to walk to the commode without leaking urine.  Baseline: maybe 1 time per week that is 1 small spot Goal status: Partially  met 09/20/23  5.  Patient educated on vaginal moisturizers to improve the health and reduce the dryness of the vaginal tissue.  Baseline:  Goal status: Met 09/20/23   PLAN: Discharge to HEP  today   Eulis Foster, PT 10/04/23 10:33 AM   PHYSICAL THERAPY DISCHARGE SUMMARY  Visits from Start of Care: 5  Current functional level related to goals / functional outcomes: See above   Remaining deficits: See above   Education / Equipment: HEP   Patient agrees to discharge. Patient goals were met. Patient is being discharged due to meeting the stated rehab goals. Thank you for the referral.   Eulis Foster, PT 10/04/23 10:45 AM

## 2023-10-05 DIAGNOSIS — R6882 Decreased libido: Secondary | ICD-10-CM | POA: Diagnosis not present

## 2023-10-10 NOTE — Progress Notes (Signed)
Initial neurology clinic note  [name] MRN: [id number] DOB: [birth year]  Referring provider: Geoffry Paradise, MD  Primary care provider: Geoffry Paradise, MD  Reason for consult:  jaw pain  Subjective:  This is [name], a 77 y.o. right-handed female with a medical history of OM of left mandible who presents to neurology clinic with jaw pain. The patient is alone today.  In 07/2022 patient started getting a tightness in her jaw (above and below lips). It does not hurt. It was infrequent at first. It has increased in frequency and intensity over the past year. She describes that she feels like there is a magnetic force bring her teeth together. She can touch anywhere around the lips to relieve symptoms. She can also put her tongue on her teeth or roof of her mouth to relieve symptoms. If she stops this though, it comes right back. It happens multiple times per day, before medication, happening 80% of the time. She was put on gabapentin 100 mg TID on 09/08/23. Gabapentin has reduced the frequency and intensity of symptoms. She only has symptoms about 40% of the time now. It does not bother her in her sleep or when she wakes up at night to go to the bathroom or when she first wakes up. She does not see any triggers or patterns to symptoms.  She denies any numbness or tingling of her face.  She has a lot of jaw and dental issues and has been seen by [facility] dental, oral surgery, and infectious disease. 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 She completed 3 months of antibiotics in 04/2023. ID felt her tightness and paresthesias may be 2/2 minor nerve injury during oral surgery.  She had a CT of her jaw on a few weeks ago. Per patient report, it shows a foreign object (possible dental Bur, surgical drill from implant placement). Per patient this could be removed, but it is not  thought to be causing her symptoms.  Patient has not had good smell for 30-40 years. She has tremor in both hands, left > right. She thinks it is present at rest and with action, worse with action. 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 She denies imbalance or falls. She denies freezing. She endorses kicking, punching, or biting in her sleep and screams as well. She denies any orthostatic hypotension.  EtOH use: very rare; has not noticed if EtOH affects tremor Restrictive diet: no, keto diet Family history of neurologic disease: Mother with dementia   MEDICATIONS:  Outpatient Encounter Medications as of 10/19/2023  Medication Sig   carbidopa-levodopa (SINEMET IR) 25-100 MG tablet Take 0.5 tablets by mouth in the morning and at bedtime.   conjugated estrogens (PREMARIN) vaginal cream Place 1 Applicatorful vaginally 2 (two) times a week. Place 0.5g nightly for two weeks then twice a week after   gabapentin (NEURONTIN) 100 MG capsule Take 100 mg by mouth 2 (two) times daily.   No facility-administered encounter medications on file as of 10/19/2023.    PAST MEDICAL HISTORY: Past Medical History:  Diagnosis Date   History of oral surgery    Osteomyelitis (HCC)     PAST SURGICAL HISTORY: Past Surgical History:  Procedure Laterality Date   ABDOMINAL HYSTERECTOMY      ALLERGIES: Allergies  Allergen Reactions   Aspirin Other (See Comments)   Ibuprofen Other (See  Comments) and Nausea And Vomiting   Short Ragweed Pollen Ext Rash and Other (See Comments)    FAMILY HISTORY: Family History  Problem Relation Age of Onset   Diabetes Mother    Heart disease Mother     SOCIAL HISTORY: Social History   Tobacco Use   Smoking status: Never   Smokeless tobacco: Current  Substance Use Topics   Alcohol use: Not Currently    Comment: rarely   Drug use: Never   Social History   Social History Narrative   Are you right handed or left handed? right   Are you  currently employed ?    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   Who lives with you? husband   What type of home do you live in: 1 story or 2 story? one    Caffiene none    Objective:  Vital Signs:  BP (!) 146/72 Comment: see pcp  Pulse 83   Ht 5\' 2"  (1.575 m)   Wt 141 lb (64 kg)   SpO2 98%   BMI 25.79 kg/m   General: No acute distress.  Patient appears well-groomed.   Head:  Normocephalic/atraumatic Neck: supple Heart: regular rate and rhythm Lungs: Clear to auscultation bilaterally. Vascular: No carotid bruits. Affect: Flat affect; hypomimia  Neurological Exam: Mental status: alert and oriented, speech fluent and not dysarthric, language intact.  Cranial nerves: CN I: not tested CN II: pupils equal, round and reactive to light, visual fields intact CN III, IV, VI:  Up gaze impaired, EOM otherwise intact, no nystagmus, no ptosis CN V: facial sensation intact to light touch and pinprick CN VII: upper and lower face symmetric CN VIII: hearing intact CN IX, X: uvula midline CN XI: sternocleidomastoid and trapezius muscles intact CN XII: tongue midline  Bulk & Tone: Increase in bilateral upper and lower extremities (RUE > LUE). Tremor in left hand at rest Motor:  muscle strength 5/5 throughout Deep Tendon Reflexes:  2-3+ throughout.   Sensation:  Pinprick sensation intact. Finger to nose testing:  Without dysmetria.   Coordination: Finger tapping on abnormal (LUE >> RUE). Toe tapping in RLE abnormal. Normal in LLE. Gait:  Narrow based gait. No freezing. No en bloc turns. Reduced arm swing.  Romberg negative.   Labs and Imaging review: HbA1c (12/13/2009): 5.9  External labs: 03/20/23: CMP unremarkable CBC w/ diff unremarkable CRP wnl ESR wnl  HbA1c (01/12/23): 5.8  Imaging: MRI brain wo contrast (11/24/2006): Findings: Diffusion imaging is negative for acute or subacute stroke. There is no sign of mass, hemorrhage,  hydrocephalus, or extraaxial collection. The pituitary gland is unremarkable. The paranasal sinuses, middle ears, and mastoids are clear. No skull or skull base abnormality is seen.   IMPRESSION:     1. Normal MRI of the brain.   Assessment/Plan:  REGLA MINK is a 77 y.o. female who presents for evaluation of jaw tightness and tremors. She has a relevant medical history of OM of left mandible. Her neurological examination is pertinent for flat affect, hypomimia, rest tremor in LUE, abnormal finger tapping and toe tapping, and increased tone in extremities. Regarding her jaw/face, she has no difficulty opening or closing her jaw. Sensation appears intact throughout to light touch and pinprick. Per patient her jaw is tight, without paresthesias. This sounds less likely to be nerve injury, though it is still possible. I am more concerned about possible dystonia of the jaw given that it can improve  with a "sensory trick" and possibly parkinsonism. We agreed to the work up below to start as well as a low dose trial of Sinemet to assess for response. Botox could also be considered if this seems consistent with jaw dystonia. That is not as clear to me today though given her normal exam of the face.  PLAN: -Blood work: B12, TSH, copper, vit E -MRI brain w/wo contrast -Continue gabapentin 100 TID -Trial of Sinemet 1/2 tablet BID   -Return to clinic 1-2 months  The impression above as well as the plan as outlined below were extensively discussed with the patient who voiced understanding. All questions were answered to their satisfaction.  The patient was counseled on pertinent fall precautions per the printed material provided today, and as noted under the "Patient Instructions" section below.  When available, results of the above investigations and possible further recommendations will be communicated to the patient via telephone/MyChart. Patient to call office if not contacted after expected testing  turnaround time.   Total time spent reviewing records, interview, history/exam, documentation, and coordination of care on day of encounter:  65 min   Thank you for allowing me to participate in patient's care.  If I can answer any additional questions, I would be pleased to do so.  Jacquelyne Balint, MD   CC: Geoffry Paradise, MD [address] [place] [address]  CC: Referring provider: Geoffry Paradise, MD [address] [place],  [place] [address]

## 2023-10-19 ENCOUNTER — Ambulatory Visit: Payer: Medicare HMO | Admitting: Neurology

## 2023-10-19 ENCOUNTER — Encounter: Payer: Self-pay | Admitting: Neurology

## 2023-10-19 ENCOUNTER — Other Ambulatory Visit (INDEPENDENT_AMBULATORY_CARE_PROVIDER_SITE_OTHER): Payer: Medicare HMO

## 2023-10-19 VITALS — BP 146/72 | HR 83 | Ht 62.0 in | Wt 141.0 lb

## 2023-10-19 DIAGNOSIS — R6884 Jaw pain: Secondary | ICD-10-CM

## 2023-10-19 DIAGNOSIS — R292 Abnormal reflex: Secondary | ICD-10-CM | POA: Diagnosis not present

## 2023-10-19 DIAGNOSIS — R251 Tremor, unspecified: Secondary | ICD-10-CM | POA: Diagnosis not present

## 2023-10-19 DIAGNOSIS — G249 Dystonia, unspecified: Secondary | ICD-10-CM

## 2023-10-19 DIAGNOSIS — G20C Parkinsonism, unspecified: Secondary | ICD-10-CM

## 2023-10-19 MED ORDER — CARBIDOPA-LEVODOPA 25-100 MG PO TABS: 0.5000 | ORAL_TABLET | Freq: Two times a day (BID) | ORAL | 5 refills | Status: DC

## 2023-10-19 NOTE — Patient Instructions (Signed)
I saw you today for the tightness in your jaw. I also noticed you had a tremor in her left hand at rest and had some incoordination of movements. Your symptoms may be a movement disorder such as dystonia or parkinsonism.  I want to investigate further with the following: -Blood work today -MRI of your brain  I will be in touch when I have the results.  I am trying a medication that can work for dystonia or parkinsonism called Sinemet. You will take 1/2 tablet twice daily. I sent this to your pharmacy.  I will see you back in clinic in 1-2 months to review results and response to the medication.  Please let me know if you have any questions or concerns in the meantime.  The physicians and staff at [facility] are committed to providing excellent care. You may receive a survey requesting feedback about your experience at our office. We strive to receive "very good" responses to the survey questions. If you feel that your experience would prevent you from giving the office a "very good " response, please contact our office to try to remedy the situation. We may be reached at [phone]. Thank you for taking the time out of your busy day to complete the survey.  Jacquelyne Balint, MD [facility]  Preventing Falls at [facility] are common, often dreaded events in the lives of older people. Aside from the obvious injuries and even death that may result, fall can cause wide-ranging consequences including loss of independence, mental decline, decreased activity and mobility. Younger people are also at risk of falling, especially those with chronic illnesses and fatigue.  Ways to reduce risk for falling Examine diet and medications. Warm foods and alcohol dilate blood vessels, which can lead to dizziness when standing. Sleep aids, antidepressants and pain medications can also increase the likelihood of a fall.  Get a vision exam. Poor vision, cataracts and glaucoma increase the chances of  falling.  Check foot gear. Shoes should fit snugly and have a sturdy, nonskid sole and a broad, low heel  Participate in a physician-approved exercise program to build and maintain muscle strength and improve balance and coordination. Programs that use ankle weights or stretch bands are excellent for muscle-strengthening. Water aerobics programs and low-impact Tai Chi programs have also been shown to improve balance and coordination.  Increase vitamin D intake. Vitamin D improves muscle strength and increases the amount of calcium the body is able to absorb and deposit in bones.  How to prevent falls from common hazards Floors - Remove all loose wires, cords, and throw rugs. Minimize clutter. Make sure rugs are anchored and smooth. Keep furniture in its usual place.  Chairs -- Use chairs with straight backs, armrests and firm seats. Add firm cushions to existing pieces to add height.  Bathroom - Install grab bars and non-skid tape in the tub or shower. Use a bathtub transfer bench or a shower chair with a back support Use an elevated toilet seat and/or safety rails to assist standing from a low surface. Do not use towel racks or bathroom tissue holders to help you stand.  Lighting - Make sure halls, stairways, and entrances are well-lit. Install a night light in your bathroom or hallway. Make sure there is a light switch at the top and bottom of the staircase. Turn lights on if you get up in the middle of the night. Make sure lamps or light switches are within reach of the bed if you  have to get up during the night.  Kitchen - Install non-skid rubber mats near the sink and stove. Clean spills immediately. Store frequently used utensils, pots, pans between waist and eye level. This helps prevent reaching and bending. Sit when getting things out of lower cupboards.  Living room/ Bedrooms - Place furniture with wide spaces in between, giving enough room to move around. Establish a route through the  living room that gives you something to hold onto as you walk.  Stairs - Make sure treads, rails, and rugs are secure. Install a rail on both sides of the stairs. If stairs are a threat, it might be helpful to arrange most of your activities on the lower level to reduce the number of times you must climb the stairs.  Entrances and doorways - Install metal handles on the walls adjacent to the doorknobs of all doors to make it more secure as you travel through the doorway.  Tips for maintaining balance Keep at least one hand free at all times. Try using a backpack or fanny pack to hold things rather than carrying them in your hands. Never carry objects in both hands when walking as this interferes with keeping your balance.  Attempt to swing both arms from front to back while walking. This might require a conscious effort if Parkinson's disease has diminished your movement. It will, however, help you to maintain balance and posture, and reduce fatigue.  Consciously lift your feet off of the ground when walking. Shuffling and dragging of the feet is a common culprit in losing your balance.  When trying to navigate turns, use a "U" technique of facing forward and making a wide turn, rather than pivoting sharply.  Try to stand with your feet shoulder-length apart. When your feet are close together for any length of time, you increase your risk of losing your balance and falling.  Do one thing at a time. Don't try to walk and accomplish another task, such as reading or looking around. The decrease in your automatic reflexes complicates motor function, so the less distraction, the better.  Do not wear rubber or gripping soled shoes, they might "catch" on the floor and cause tripping.  Move slowly when changing positions. Use deliberate, concentrated movements and, if needed, use a grab bar or walking aid. Count 15 seconds between each movement. For example, when rising from a seated position, wait 15  seconds after standing to begin walking.  If balance is a continuous problem, you might want to consider a walking aid such as a cane, walking stick, or walker. Once you've mastered walking with help, you might be ready to try it on your own again.

## 2023-10-24 LAB — TSH: TSH: 1.65 m[IU]/L (ref 0.40–4.50)

## 2023-10-24 LAB — VITAMIN E
Gamma-Tocopherol (Vit E): 1.2 mg/L (ref <4.4)
Vitamin E (Alpha Tocopherol): 15.2 mg/L (ref 5.7–19.9)

## 2023-10-24 LAB — VITAMIN B12: Vitamin B-12: 281 pg/mL (ref 200–1100)

## 2023-10-24 LAB — COPPER, SERUM: Copper: 98 ug/dL (ref 70–175)

## 2023-10-25 ENCOUNTER — Encounter: Payer: Self-pay | Admitting: Neurology

## 2023-10-29 DIAGNOSIS — Z961 Presence of intraocular lens: Secondary | ICD-10-CM | POA: Diagnosis not present

## 2023-10-29 DIAGNOSIS — H25812 Combined forms of age-related cataract, left eye: Secondary | ICD-10-CM | POA: Diagnosis not present

## 2023-10-29 DIAGNOSIS — H2512 Age-related nuclear cataract, left eye: Secondary | ICD-10-CM | POA: Diagnosis not present

## 2023-11-08 ENCOUNTER — Encounter: Payer: Self-pay | Admitting: Neurology

## 2023-11-13 NOTE — Progress Notes (Signed)
NEUROLOGY FOLLOW UP OFFICE NOTE  Sara Mason [id number]  Subjective:  Sara Mason is a 77 y.o. year old right handed female with a history of OM of left mandible who we last saw on 10/19/23 for jaw tightness and tremors.  To briefly review: 10/19/23: In 07/2022 patient started getting a tightness in her jaw (above and below lips). It does not hurt. It was infrequent at first. It has increased in frequency and intensity over the past year. She describes that she feels like there is a magnetic force bring her teeth together. She can touch anywhere around the lips to relieve symptoms. She can also put her tongue on her teeth or roof of her mouth to relieve symptoms. If she stops this though, it comes right back. It happens multiple times per day, before medication, happening 80% of the time. She was put on gabapentin 100 mg TID on 09/08/23. Gabapentin has reduced the frequency and intensity of symptoms. She only has symptoms about 40% of the time now. It does not bother her in her sleep or when she wakes up at night to go to the bathroom or when she first wakes up. She does not see any triggers or patterns to symptoms.   She denies any numbness or tingling of her face.   She has a lot of jaw and dental issues and has been seen by [facility] dental, oral surgery, and infectious disease. 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 She completed 3 months of antibiotics in 04/2023. ID felt her tightness and paresthesias may be 2/2 minor nerve injury during oral surgery.   She had a CT of her jaw on a few weeks ago. Per patient report, it shows a foreign object (possible dental Bur, surgical drill from implant placement). Per patient this could be removed, but it is not thought to be causing her symptoms.   Patient has not had good smell for 30-40 years. She has tremor in both hands, left > right. She  thinks it is present at rest and with action, worse with action. 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 She denies imbalance or falls. She denies freezing. She endorses kicking, punching, or biting in her sleep and screams as well. She denies any orthostatic hypotension.   EtOH use: very rare; has not noticed if EtOH affects tremor Restrictive diet: no, keto diet Family history of neurologic disease: Mother with dementia  Most recent Assessment and Plan (10/19/23): Sara Mason is a 77 y.o. female who presents for evaluation of jaw tightness and tremors. She has a relevant medical history of OM of left mandible. Her neurological examination is pertinent for flat affect, hypomimia, rest tremor in LUE, abnormal finger tapping and toe tapping, and increased tone in extremities. Regarding her jaw/face, she has no difficulty opening or closing her jaw. Sensation appears intact throughout to light touch and pinprick. Per patient her jaw is tight, without paresthesias. This sounds less likely to be nerve injury, though it is still possible. I am more concerned about possible dystonia of the jaw given that it can improve with a "sensory trick" and possibly parkinsonism. We agreed to the work up below to start as well as a low dose trial of Sinemet to assess for response. Botox could also be considered if this seems consistent with jaw dystonia. That is not as clear to  me today though given her normal exam of the face.   PLAN: -Blood work: B12, TSH, copper, vit E -MRI brain w/wo contrast -Continue gabapentin 100 TID -Trial of Sinemet 1/2 tablet BID   Since their last visit: Labs were significant for borderline low B12. I recommended B12 1000 mcg daily.  Patient is taking this  Patient mentioned in MyChart message from 10/25/23 that she felt improvement with Sinemet after 2 days with no side effects. She states the tremors improved. The jaw tightness remained about the same. Her tremor  gradually got worse after about a week of medication. There has been no significant change in symptoms.  MRI brain on 11/14/23 showed no acute process. There was some T2 hyperintensity in hypothalamic region which can be seen in Wernicke Encephalopathy. She does report some slowness in retrieval, like word finding difficulty. She does not currently having vision changes, but does report diplopia in the past. She is getting cataract surgery in the right eye next week. She thinks her balance is okay and reports no falls.  She reports no significant weight loss. She keeps a keto diet. She does not drink alcohol.  MEDICATIONS:  Outpatient Encounter Medications as of 11/22/2023  Medication Sig   carbidopa-levodopa (SINEMET IR) 25-100 MG tablet Take 0.5 tablets by mouth in the morning and at bedtime.   conjugated estrogens (PREMARIN) vaginal cream Place 1 Applicatorful vaginally 2 (two) times a week. Place 0.5g nightly for two weeks then twice a week after   gabapentin (NEURONTIN) 100 MG capsule Take 100 mg by mouth 2 (two) times daily. Patient reports she takes medicine 3 times daily   No facility-administered encounter medications on file as of 11/22/2023.    PAST MEDICAL HISTORY: Past Medical History:  Diagnosis Date   History of oral surgery    Osteomyelitis (HCC)     PAST SURGICAL HISTORY: Past Surgical History:  Procedure Laterality Date   ABDOMINAL HYSTERECTOMY     CATARACT EXTRACTION Right 2024    ALLERGIES: Allergies  Allergen Reactions   Aspirin Other (See Comments)   Ibuprofen Other (See Comments) and Nausea And Vomiting   Short Ragweed Pollen Ext Rash and Other (See Comments)    FAMILY HISTORY: Family History  Problem Relation Age of Onset   Diabetes Mother    Heart disease Mother     SOCIAL HISTORY: Social History   Tobacco Use   Smoking status: Never   Smokeless tobacco: Current  Substance Use Topics   Alcohol use: Not Currently    Comment: rarely   Drug  use: Never   Social History   Social History Narrative   Are you right handed or left handed? right   Are you currently employed ?    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   Who lives with you? husband   What type of home do you live in: 1 story or 2 story? one    Caffiene none      Objective:  Vital Signs:  BP (!) 149/83   Pulse 97   Ht 5\' 2"  (1.575 m)   Wt 138 lb (62.6 kg)   SpO2 97%   BMI 25.24 kg/m   General: No acute distress.  Patient appears well-groomed.   Head:  Normocephalic/atraumatic Neck: supple, no paraspinal tenderness, full range of motion Face: Click of jaw bilaterally with opening and closing, but no significant pain. No tenderness to palpation of jaw or temples. Lungs: Non-labored  breathing on room air  Affect: ?Flat  Neurological Exam: Mental status: alert and oriented, speech fluent and not dysarthric, language intact.  Cranial nerves: CN I: not tested CN II: pupils equal, round and reactive to light, visual fields intact CN III, IV, VI:  Choppy pursuit. Restricted up and down gaze, no nystagmus, no obvious ptosis CN V: facial sensation intact. CN VII: upper and lower face symmetric. Eye closure strong CN VIII: hearing intact CN IX, X: uvula midline CN XI: sternocleidomastoid and trapezius muscles intact CN XII: tongue midline  Bulk & Tone: normal, no fasciculations. Rest tremor appreciated in left hand (intermittent) Motor:  muscle strength 5/5 throughout Deep Tendon Reflexes:  2-3+ throughout.   Sensation:  Light touch sensation intact. Finger to nose testing:  Without dysmetria.   Coordination: Finger tapping abnormal (LUE >> RUE) and tapping taping abnormal in RLE. Pull test with multiple steps backward requiring me to catch patient. Gait:  Normal station, reduced arm swing. No clear freezing or en bloc turning.   Labs and Imaging review: New results: 10/19/23: B12: 281 TSH wnl Vit E wnl Copper  wnl  MRI brain w/wo contrast (11/14/23): FINDINGS: Brain: Diffusion imaging does not show any acute or subacute infarction. No abnormality affects the brainstem or cerebellum. No finding of the midbrain specific for Parkinson's disease. One could question mild increased FLAIR and T2 signal within the hypothalamic region. This may not be pathologic, but does at least raise the possibility of Wernicke Encephalopathy. Is there any clinical consideration of that? Cerebral hemispheres otherwise show only mild age related volume loss without subjective lobar predominance. Few punctate foci of T2 and FLAIR signal scattered within the white matter, less than often seen at this age in not indicative of significant small-vessel disease. No mass, hemorrhage, hydrocephalus or extra-axial collection. After contrast administration, no abnormal enhancement occurs.   Vascular: Major vessels at the base of the brain show flow.   Skull and upper cervical spine: Negative   Sinuses/Orbits: Clear/normal   Other: None   IMPRESSION: 1. Sign of acute or subacute stroke. No specific finding for Parkinson's disease. 2. One could question mild increased FLAIR and T2 signal within the hypothalamic region. This may not be pathologic, but does at least raise the possibility of Wernicke Encephalopathy. Is there any clinical consideration of that? 3. The brain does not show advanced generalized atrophy or significant small-vessel disease.   Previously reviewed results: HbA1c (12/13/2009): 5.9   External labs: 03/20/23: CMP unremarkable CBC w/ diff unremarkable CRP wnl ESR wnl   HbA1c (01/12/23): 5.8   Imaging: MRI brain wo contrast (11/24/2006): Findings: Diffusion imaging is negative for acute or subacute stroke. There is no sign of mass, hemorrhage, hydrocephalus, or extraaxial collection. The pituitary gland is unremarkable. The paranasal sinuses, middle ears, and mastoids are clear. No skull or  skull base abnormality is seen.   IMPRESSION:     1. Normal MRI of the brain.   Assessment/Plan:  [name], a 77 y.o. female with jaw tightness and tremors. She has a relevant medical history of OM of left mandible that was originally thought to be the cause of her jaw symptoms. Her neurological examination is pertinent for flat affect, hypomimia, rest tremor in LUE, abnormal finger tapping and toe tapping, reduced arm swing, and imbalance on pull testing. Per patient her jaw is tight, without paresthesias. This sounds less likely to be nerve injury, though it is still possible. I am more concerned about possible dystonia  of the jaw given that it can improve with a "sensory trick" and possibly parkinsonism. She had some initial response in terms of tremor to very low dose Sinemet (1/2 tablet BID), but symptoms returned. MRI brain showed some T2 hyperintensity in bilateral hypothalamic area that may be consistent with thiamine deficiency. While this is possible and I will check for today, I'm not sure that explains her jaw tightness or tremors. B12 was also found to be borderline low, which she is supplementing.  Plan: -Blood work: B1, folate, vit D -Increase Sinemet to 1 tablet TID -Recommend B1 100 mg daily due to possible thiamine deficiency -Continue B12 1000 mcg daily  Return to clinic in 3 months  Total time spent reviewing records, interview, history/exam, documentation, and coordination of care on day of encounter:  40 min  Jacquelyne Balint, MD

## 2023-11-14 ENCOUNTER — Ambulatory Visit
Admission: RE | Admit: 2023-11-14 | Discharge: 2023-11-14 | Disposition: A | Discharge status: Home / Self Care | Payer: Medicare HMO | Source: Ambulatory Visit | Attending: Neurology | Admitting: Neurology

## 2023-11-14 DIAGNOSIS — R251 Tremor, unspecified: Secondary | ICD-10-CM | POA: Diagnosis not present

## 2023-11-14 DIAGNOSIS — G249 Dystonia, unspecified: Secondary | ICD-10-CM

## 2023-11-14 DIAGNOSIS — G20C Parkinsonism, unspecified: Secondary | ICD-10-CM

## 2023-11-14 DIAGNOSIS — R9089 Other abnormal findings on diagnostic imaging of central nervous system: Secondary | ICD-10-CM | POA: Diagnosis not present

## 2023-11-14 DIAGNOSIS — G252 Other specified forms of tremor: Secondary | ICD-10-CM | POA: Diagnosis not present

## 2023-11-14 DIAGNOSIS — G248 Other dystonia: Secondary | ICD-10-CM | POA: Diagnosis not present

## 2023-11-14 MED ORDER — GADOPICLENOL 0.5 MMOL/ML IV SOLN
6.0000 mL | Freq: Once | INTRAVENOUS | Status: AC | PRN
  Administered 2023-11-14: 6 mL via INTRAVENOUS

## 2023-11-19 ENCOUNTER — Encounter: Payer: Self-pay | Admitting: Obstetrics and Gynecology

## 2023-11-19 ENCOUNTER — Ambulatory Visit: Payer: Medicare HMO | Admitting: Obstetrics and Gynecology

## 2023-11-19 VITALS — BP 135/82 | HR 82

## 2023-11-19 DIAGNOSIS — R151 Fecal smearing: Secondary | ICD-10-CM

## 2023-11-19 DIAGNOSIS — N8110 Cystocele, unspecified: Secondary | ICD-10-CM

## 2023-11-19 DIAGNOSIS — K5901 Slow transit constipation: Secondary | ICD-10-CM

## 2023-11-19 DIAGNOSIS — N813 Complete uterovaginal prolapse: Secondary | ICD-10-CM | POA: Diagnosis not present

## 2023-11-19 DIAGNOSIS — N993 Prolapse of vaginal vault after hysterectomy: Secondary | ICD-10-CM

## 2023-11-19 DIAGNOSIS — N816 Rectocele: Secondary | ICD-10-CM

## 2023-11-19 NOTE — Progress Notes (Signed)
[facility]   Subjective:     Chief Complaint: Follow-up CATLIN MCKIVER is a 77 y.o. female is here due to difficulty urinating.)  History of Present Illness: JAKOBI MAINWARING is a 77 y.o. female with stage III pelvic organ prolapse who presents today for a pessary fitting.    Past Medical History: Patient  has a past medical history of History of oral surgery and Osteomyelitis (HCC).   Past Surgical History: She  has a past surgical history that includes Abdominal hysterectomy.   Medications: She has a current medication list which includes the following prescription(s): carbidopa-levodopa, conjugated estrogens, and gabapentin.   Allergies: Patient is allergic to aspirin, ibuprofen, and short ragweed pollen ext.   Social History: Patient  reports that she has never smoked. She uses smokeless tobacco. She reports that she does not currently use alcohol. She reports that she does not use drugs.      Objective:    BP 135/82   Pulse 82  Gen: No apparent distress, A&O x 3. Pelvic Exam: Normal external female genitalia; Bartholin's and Skene's glands normal in appearance; urethral meatus normal in appearance, no urethral masses or discharge.   A size #2 donut (Lot [id number]) pessary was fitted. It was comfortable, stayed in place with valsalva and was an appropriate size on examination, with one finger fitting between the pessary and the vaginal walls. We tied a string to it but patient was unable to place or remove.   Assessment/Plan:    Assessment: Ms. Macklin is a 77 y.o. with stage III pelvic organ prolapse who presents for a pessary fitting. Plan: She was fitted with a #2 donut pessary. She will keep the pessary in place until next visit. She will use lubricant.   Follow-up in 3 weeks for a pessary check and we can try again to place a remove. We also discussed trying a small inflatoball pessary that may be easier to place and remove for patient.  All  questions were answered.    Selmer Dominion, NP

## 2023-11-19 NOTE — Patient Instructions (Addendum)
We will plan to try a different pessary at the next

## 2023-11-22 ENCOUNTER — Encounter: Payer: Self-pay | Admitting: Neurology

## 2023-11-22 ENCOUNTER — Other Ambulatory Visit: Payer: Medicare HMO

## 2023-11-22 ENCOUNTER — Ambulatory Visit: Payer: Medicare HMO | Admitting: Neurology

## 2023-11-22 VITALS — BP 134/84 | HR 97 | Ht 62.0 in | Wt 138.0 lb

## 2023-11-22 DIAGNOSIS — E569 Vitamin deficiency, unspecified: Secondary | ICD-10-CM | POA: Diagnosis not present

## 2023-11-22 DIAGNOSIS — R930 Abnormal findings on diagnostic imaging of skull and head, not elsewhere classified: Secondary | ICD-10-CM

## 2023-11-22 DIAGNOSIS — G249 Dystonia, unspecified: Secondary | ICD-10-CM

## 2023-11-22 DIAGNOSIS — G20C Parkinsonism, unspecified: Secondary | ICD-10-CM

## 2023-11-22 DIAGNOSIS — R251 Tremor, unspecified: Secondary | ICD-10-CM | POA: Diagnosis not present

## 2023-11-22 MED ORDER — CARBIDOPA-LEVODOPA 25-100 MG PO TABS: 1.0000 | ORAL_TABLET | Freq: Three times a day (TID) | ORAL | 5 refills | Status: DC

## 2023-11-22 NOTE — Patient Instructions (Addendum)
I want to check some blood work today.  I want you to take vitamin B1 (aka thiamine) 100 mg daily due to the changes seen on MRI that could be nothing or due to thiamine deficiency.  Continue B12 1000 mcg daily  Increase the Sinemet to 1 tablet three times a day. I have sent a new prescription to your pharmacy.  I will call you in a few weeks to check on your symptoms and see you back in clinic in about 3 months. Please let me know if you have any questions or concerns in the meantime.  The physicians and staff at [facility] are committed to providing excellent care. You may receive a survey requesting feedback about your experience at our office. We strive to receive "very good" responses to the survey questions. If you feel that your experience would prevent you from giving the office a "very good " response, please contact our office to try to remedy the situation. We may be reached at [phone]. Thank you for taking the time out of your busy day to complete the survey.  Jacquelyne Balint, MD [facility]

## 2023-11-26 ENCOUNTER — Encounter: Payer: Self-pay | Admitting: Neurology

## 2023-11-26 DIAGNOSIS — Z961 Presence of intraocular lens: Secondary | ICD-10-CM | POA: Diagnosis not present

## 2023-11-26 DIAGNOSIS — H2511 Age-related nuclear cataract, right eye: Secondary | ICD-10-CM | POA: Diagnosis not present

## 2023-11-26 DIAGNOSIS — H25811 Combined forms of age-related cataract, right eye: Secondary | ICD-10-CM | POA: Diagnosis not present

## 2023-11-26 LAB — FOLATE: Folate: 14.4 ng/mL

## 2023-11-26 LAB — VITAMIN D 25 HYDROXY (VIT D DEFICIENCY, FRACTURES): Vit D, 25-Hydroxy: 31 ng/mL (ref 30–100)

## 2023-11-26 LAB — VITAMIN B1: Vitamin B1 (Thiamine): 9 nmol/L (ref 8–30)

## 2023-12-03 ENCOUNTER — Encounter: Payer: Self-pay | Admitting: Obstetrics and Gynecology

## 2023-12-03 ENCOUNTER — Ambulatory Visit: Payer: Medicare HMO | Admitting: Obstetrics and Gynecology

## 2023-12-03 VITALS — BP 152/85 | HR 71

## 2023-12-03 DIAGNOSIS — N813 Complete uterovaginal prolapse: Secondary | ICD-10-CM

## 2023-12-03 DIAGNOSIS — N952 Postmenopausal atrophic vaginitis: Secondary | ICD-10-CM

## 2023-12-03 DIAGNOSIS — N8110 Cystocele, unspecified: Secondary | ICD-10-CM

## 2023-12-03 DIAGNOSIS — N993 Prolapse of vaginal vault after hysterectomy: Secondary | ICD-10-CM

## 2023-12-03 DIAGNOSIS — N816 Rectocele: Secondary | ICD-10-CM

## 2023-12-03 NOTE — Patient Instructions (Signed)
"  Power Pudding" is a natural mixture that may help your constipation.  To make blend 1 cup applesauce, 1 cup wheat bran, and 3/4 cup prune juice, refrigerate and then take 1 tablespoon daily with a large glass of water as needed.

## 2023-12-03 NOTE — Progress Notes (Signed)
[facility]   Subjective:     Chief Complaint:  Chief Complaint  Patient presents with   Pessary Check    Sara Mason is a 77 y.o. female is here for pessary check.   History of Present Illness: Sara Mason is a 77 y.o. female with stage III pelvic organ prolapse who presents for a pessary check. She is using a size #2 donut pessary. The pessary has been working well and she has no complaints. She is not using vaginal estrogen. She denies vaginal bleeding.  Past Medical History: Patient  has a past medical history of History of oral surgery and Osteomyelitis (HCC).   Past Surgical History: She  has a past surgical history that includes Abdominal hysterectomy and Cataract extraction (Right, 2024).   Medications: She has a current medication list which includes the following prescription(s): carbidopa-levodopa, conjugated estrogens, and gabapentin.   Allergies: Patient is allergic to aspirin, ibuprofen, and short ragweed pollen ext.   Social History: Patient  reports that she has never smoked. She uses smokeless tobacco. She reports that she does not currently use alcohol. She reports that she does not use drugs.      Objective:    Physical Exam: BP (!) 152/85   Pulse 71  Gen: No apparent distress, A&O x 3. Detailed Urogynecologic Evaluation:  Pelvic Exam: Normal external female genitalia; Bartholin's and Skene's glands normal in appearance; urethral meatus normal in appearance, no urethral masses or discharge. The pessary was noted to be in place. It was removed and cleaned. Speculum exam revealed no lesions in the vagina.  We attempted using a large standing mirror to remove and replace the pessary together.  Patient was unable to have enough grip strength to push the pessary into the vagina.  We discussed together that with her new diagnosis of Parkinson's that this makes sense and that would be very difficult for her to remove or placed any type of  pessary.  Patient was tearful and understanding and agreed to allow me to handle this portion of her care.  The pessary was replaced with the plan that she will come back in 2 months as she is not comfortable with 58-month follow-up at this point.  Potentially after the initial 36-month.  She may be more comfortable with 3 months at a time.  Assessment/Plan:    Assessment: Sara Mason is a 77 y.o. with stage III pelvic organ prolapse here for a pessary check. She is doing well.  Plan: She will keep the pessary in place until next visit. She will continue to use lubricant. She will follow-up in 2 months for a pessary check or sooner as needed.  All questions were answered.

## 2023-12-11 ENCOUNTER — Telehealth: Payer: Self-pay | Admitting: Neurology

## 2023-12-11 NOTE — Telephone Encounter (Signed)
 Attempted to call patient about normal lab results (has not read mychart message I sent). I also wanted to see how she is doing on increased Sinemet  dose. I asked for a call back to the office or to respond to the MyChart message.  Venetia Potters, MD [facility]

## 2023-12-14 ENCOUNTER — Encounter: Payer: Self-pay | Admitting: Neurology

## 2023-12-19 ENCOUNTER — Telehealth: Payer: Self-pay | Admitting: Neurology

## 2023-12-19 NOTE — Telephone Encounter (Signed)
 Patient is having trouble with the internet and could not respond to the mychart message to Dr Genita Keys.  She wants to give a update on the medication please call

## 2023-12-20 NOTE — Telephone Encounter (Signed)
Concerns where addressed Via MY Chart by Dr. Loleta Chance.

## 2024-01-10 ENCOUNTER — Other Ambulatory Visit: Payer: Self-pay

## 2024-01-10 DIAGNOSIS — R251 Tremor, unspecified: Secondary | ICD-10-CM

## 2024-01-10 DIAGNOSIS — G20C Parkinsonism, unspecified: Secondary | ICD-10-CM

## 2024-01-11 ENCOUNTER — Telehealth: Payer: Self-pay

## 2024-01-11 NOTE — Telephone Encounter (Signed)
 Called pt and made her aware of Approval for Dat scan. And the copay is 325, she was okay with that and is waiting on them to call and schedule. Message was sent to [facility] via [employer] .

## 2024-01-22 DIAGNOSIS — E785 Hyperlipidemia, unspecified: Secondary | ICD-10-CM | POA: Diagnosis not present

## 2024-01-22 DIAGNOSIS — R7301 Impaired fasting glucose: Secondary | ICD-10-CM | POA: Diagnosis not present

## 2024-01-25 ENCOUNTER — Encounter (HOSPITAL_COMMUNITY)
Admission: RE | Admit: 2024-01-25 | Discharge: 2024-01-25 | Disposition: A | Discharge status: Home / Self Care | Payer: Medicare HMO | Source: Ambulatory Visit | Attending: Neurology | Admitting: Neurology

## 2024-01-25 ENCOUNTER — Telehealth: Payer: Self-pay | Admitting: Neurology

## 2024-01-25 DIAGNOSIS — G20C Parkinsonism, unspecified: Secondary | ICD-10-CM | POA: Diagnosis not present

## 2024-01-25 DIAGNOSIS — R251 Tremor, unspecified: Secondary | ICD-10-CM | POA: Diagnosis not present

## 2024-01-25 MED ORDER — IOFLUPANE I 123 185 MBQ/2.5ML IV SOLN
4.7000 | Freq: Once | INTRAVENOUS | Status: AC | PRN
Start: 2024-01-25 — End: 2024-01-25
  Administered 2024-01-25: 4.7 via INTRAVENOUS

## 2024-01-25 MED ORDER — POTASSIUM IODIDE (ANTIDOTE) 130 MG PO TABS
130.0000 mg | ORAL_TABLET | Freq: Once | ORAL | Status: AC
  Administered 2024-01-25: 130 mg via ORAL

## 2024-01-25 MED ORDER — POTASSIUM IODIDE (ANTIDOTE) 130 MG PO TABS
ORAL_TABLET | ORAL | Status: AC
  Filled 2024-01-25: qty 1

## 2024-01-25 NOTE — Telephone Encounter (Signed)
 Called patient to discuss DaTscan results. It was abnormal: FINDINGS: Marked decreased radiotracer activity within the LEFT and RIGHT putamen. Decreased relative radiotracer activity the heads of the caudate nuclei.   IMPRESSION: Significant decreased striatal Ioflupane activity as above. Pattern is typical of Parkinsonian syndrome pathology.   Of note, DaTSCAN is not diagnostic of Parkinsonian syndromes, which remains a clinical diagnosis. DaTscan is an adjuvant test to aid in the clinical diagnosis of Parkinsonian syndromes.  I explained there is more evidence of parkinsonism, likely either Parkinson's disease or PSP. She is currently not sure if she has any response to Sinemet 1 tab TID. I recommended skin biopsy as this would help differentiate PD from PSP. Patient would like to keep Sinemet as is until she gets results from skin biopsy.  Patient is having a lot of brain fog and wondering if it is related to gabapentin. She is currently only taking gabapentin 100 mg BID though. I'm not sure if this is related to gabapentin, parkinsonism, or something else. I told her she can try stopping the gabapentin if she would like. She thinks it helps with her jaw pain, but is willing to try stopping it to see what happens.  All questions were answered.  Jacquelyne Balint, MD [facility] Neurology

## 2024-01-28 DIAGNOSIS — Z0000 Encounter for general adult medical examination without abnormal findings: Secondary | ICD-10-CM | POA: Diagnosis not present

## 2024-01-28 DIAGNOSIS — E785 Hyperlipidemia, unspecified: Secondary | ICD-10-CM | POA: Diagnosis not present

## 2024-01-28 DIAGNOSIS — G20A1 Parkinson's disease without dyskinesia, without mention of fluctuations: Secondary | ICD-10-CM | POA: Diagnosis not present

## 2024-01-28 DIAGNOSIS — N814 Uterovaginal prolapse, unspecified: Secondary | ICD-10-CM | POA: Diagnosis not present

## 2024-01-28 DIAGNOSIS — G629 Polyneuropathy, unspecified: Secondary | ICD-10-CM | POA: Diagnosis not present

## 2024-01-28 DIAGNOSIS — R200 Anesthesia of skin: Secondary | ICD-10-CM | POA: Diagnosis not present

## 2024-01-28 DIAGNOSIS — Z1331 Encounter for screening for depression: Secondary | ICD-10-CM | POA: Diagnosis not present

## 2024-01-28 DIAGNOSIS — R7301 Impaired fasting glucose: Secondary | ICD-10-CM | POA: Diagnosis not present

## 2024-01-28 DIAGNOSIS — Z1339 Encounter for screening examination for other mental health and behavioral disorders: Secondary | ICD-10-CM | POA: Diagnosis not present

## 2024-01-28 DIAGNOSIS — R82998 Other abnormal findings in urine: Secondary | ICD-10-CM | POA: Diagnosis not present

## 2024-02-01 ENCOUNTER — Encounter: Payer: Self-pay | Admitting: Obstetrics and Gynecology

## 2024-02-01 ENCOUNTER — Ambulatory Visit: Payer: Medicare HMO | Admitting: Obstetrics and Gynecology

## 2024-02-01 VITALS — BP 121/76 | HR 75

## 2024-02-01 DIAGNOSIS — N952 Postmenopausal atrophic vaginitis: Secondary | ICD-10-CM

## 2024-02-01 DIAGNOSIS — N813 Complete uterovaginal prolapse: Secondary | ICD-10-CM | POA: Diagnosis not present

## 2024-02-01 DIAGNOSIS — N8110 Cystocele, unspecified: Secondary | ICD-10-CM

## 2024-02-01 DIAGNOSIS — N816 Rectocele: Secondary | ICD-10-CM

## 2024-02-01 DIAGNOSIS — N993 Prolapse of vaginal vault after hysterectomy: Secondary | ICD-10-CM

## 2024-02-01 NOTE — Patient Instructions (Addendum)
 Use estrogen Monday, Wednesday and Friday and try to get it further into the vagina.   Continue to do things that will keep your fine motor skills up such as puzzles and writing notes.

## 2024-02-01 NOTE — Progress Notes (Signed)
  Urogynecology   Subjective:     Chief Complaint:  Chief Complaint  Patient presents with   Pessary Check    Sara Mason is a 78 y.o. female is here for pessary check.   History of Present Illness: Sara Mason is a 78 y.o. female with stage III pelvic organ prolapse who presents for a pessary check. She is using a size #2 donut pessary. The pessary has been working well and she has no complaints. She is using vaginal estrogen. She denies vaginal bleeding.  Past Medical History: Patient  has a past medical history of History of oral surgery and Osteomyelitis (HCC).   Past Surgical History: She  has a past surgical history that includes Abdominal hysterectomy and Cataract extraction (Right, 2024).   Medications: She has a current medication list which includes the following prescription(s): carbidopa-levodopa, conjugated estrogens, and gabapentin.   Allergies: Patient is allergic to aspirin, ibuprofen, and short ragweed pollen ext.   Social History: Patient  reports that she has never smoked. She uses smokeless tobacco. She reports that she does not currently use alcohol. She reports that she does not use drugs.      Objective:    Physical Exam: BP 121/76   Pulse 75  Gen: No apparent distress, A&O x 3. Detailed Urogynecologic Evaluation:  Pelvic Exam: Normal external female genitalia; Bartholin's and Skene's glands normal in appearance; urethral meatus normal in appearance, no urethral masses or discharge. The pessary was noted to be in place. It was removed and cleaned. Speculum exam revealed erythema in the vagina. The pessary was replaced.   Assessment/Plan:    Assessment: [name] is a 78 y.o. with stage III pelvic organ prolapse here for a pessary check. She is doing well.  Plan: She will keep the pessary in place until next visit. She will continue to use estrogen. She will follow-up in 2 months for a pessary check or sooner as needed.  All  questions were answered.

## 2024-02-04 DIAGNOSIS — Z1231 Encounter for screening mammogram for malignant neoplasm of breast: Secondary | ICD-10-CM | POA: Diagnosis not present

## 2024-02-12 ENCOUNTER — Ambulatory Visit (INDEPENDENT_AMBULATORY_CARE_PROVIDER_SITE_OTHER): Payer: Medicare HMO | Admitting: Neurology

## 2024-02-12 DIAGNOSIS — G20C Parkinsonism, unspecified: Secondary | ICD-10-CM

## 2024-02-12 NOTE — Progress Notes (Signed)
 Punch Biopsy Procedure Note  Preprocedure Diagnosis: parkinsonism   Postprocedure Diagnosis: same  Locations: Site 1: right lateral distal leg;  Site 2: right lateral thigh;  Site 3: right shoulder  Indications: r/o small fiber neuropathy  Anesthesia: 5 mL Lidocaine 1% with epinephrine  Procedure Details Patient informed of the risks (including but not limited to bleeding, pain, infection, scar and infection) and benefits of the procedure.  Informed consent obtained.  The areas which were chosen for biopsy, as above, and surrounding areas were given a sterile prep using alcohol and iodine. The skin was then stretched perpendicular to the skin tension lines and sample removed using the 3 mm punch. Pressure applied, hemostasis achieved.   Dressing applied. The specimen(s) was sent for pathologic examination. The patient tolerated the procedure well.  Estimated Blood Loss: 1 ml  Condition: Stable  Complications: none.  Plan: 1. Instructed to keep the wound dry and covered for 24h and clean thereafter. 2. Warning signs of infection were reviewed.    Jacquelyne Balint, MD [facility] Neurology

## 2024-02-26 ENCOUNTER — Encounter: Payer: Self-pay | Admitting: Neurology

## 2024-03-01 DIAGNOSIS — G20C Parkinsonism, unspecified: Secondary | ICD-10-CM | POA: Diagnosis not present

## 2024-03-04 ENCOUNTER — Telehealth: Payer: Self-pay | Admitting: Neurology

## 2024-03-04 DIAGNOSIS — G20A1 Parkinson's disease without dyskinesia, without mention of fluctuations: Secondary | ICD-10-CM

## 2024-03-04 MED ORDER — CARBIDOPA-LEVODOPA ER 25-100 MG PO TBCR: 1.0000 | EXTENDED_RELEASE_TABLET | Freq: Three times a day (TID) | ORAL | 5 refills | Status: DC

## 2024-03-04 NOTE — Telephone Encounter (Signed)
 I called patient and discussed skin biopsy results, which were positive for alpha-synuclein. With the DaTscan and her examination, this is consistent with the diagnosis of idiopathic Parkinson's disease. I explained this to patient.  She had previously been taking Sinemet 25-100 mg TID and initially felt it helped, then wasn't so sure, then felt it was causing brain fog, so she stopped it. Now she states the brain fog has returned despite being off Sinemet several days. I explained we could do further cognitive testing (MoCA) at her next clinic visit at the end of this month, and consider neuropsych testing if there are concerns. I am not sure the etiology of this brain fog at this point.  To treat PD, I recommended we try the controlled release of Sinemet to see if this helped her better. She agreed to try. She will take Sinemet CR 25-100 mg TID and follow up with me as planned later this month.  All questions were answered.  Sara Balint, MD [facility] Neurology

## 2024-03-04 NOTE — Telephone Encounter (Signed)
 Attempted to call patient regarding skin biopsy results. Her results along with DaTscan results are consistent with Parkinson's disease. I plan to discuss this and other treatment options. I also sent patient a MyChart message.  Jacquelyne Balint, MD [facility] Neurology

## 2024-03-04 NOTE — Telephone Encounter (Signed)
 Pt called in and left a message. She is returning Dr. Adaline Sill call. She says she is available at 4 pm today

## 2024-03-18 ENCOUNTER — Encounter: Payer: Self-pay | Admitting: Neurology

## 2024-03-20 DIAGNOSIS — G20A1 Parkinson's disease without dyskinesia, without mention of fluctuations: Secondary | ICD-10-CM | POA: Diagnosis not present

## 2024-03-20 DIAGNOSIS — W57XXXA Bitten or stung by nonvenomous insect and other nonvenomous arthropods, initial encounter: Secondary | ICD-10-CM | POA: Diagnosis not present

## 2024-03-20 DIAGNOSIS — R4189 Other symptoms and signs involving cognitive functions and awareness: Secondary | ICD-10-CM | POA: Diagnosis not present

## 2024-03-24 NOTE — Progress Notes (Signed)
 NEUROLOGY FOLLOW UP OFFICE NOTE  Sara Mason [id number]  Subjective:  Sara Mason is a 78 y.o. year old right handed female with a history of OM of left mandible who we last saw on 11/22/23 for jaw tightness, tremor, and parkinsonism.  To briefly review: 10/19/23: In 07/2022 patient started getting a tightness in her jaw (above and below lips). It does not hurt. It was infrequent at first. It has increased in frequency and intensity over the past year. She describes that she feels like there is a magnetic force bring her teeth together. She can touch anywhere around the lips to relieve symptoms. She can also put her tongue on her teeth or roof of her mouth to relieve symptoms. If she stops this though, it comes right back. It happens multiple times per day, before medication, happening 80% of the time. She was put on gabapentin 100 mg TID on 09/08/23. Gabapentin has reduced the frequency and intensity of symptoms. She only has symptoms about 40% of the time now. It does not bother her in her sleep or when she wakes up at night to go to the bathroom or when she first wakes up. She does not see any triggers or patterns to symptoms.   She denies any numbness or tingling of her face.   She has a lot of jaw and dental issues and has been seen by [facility] dental, oral surgery, and infectious disease. 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 She completed 3 months of antibiotics in 04/2023. ID felt her tightness and paresthesias may be 2/2 minor nerve injury during oral surgery.   She had a CT of her jaw on a few weeks ago. Per patient report, it shows a foreign object (possible dental Bur, surgical drill from implant placement). Per patient this could be removed, but it is not thought to be causing her symptoms.   Patient has not had good smell for 30-40 years. She has tremor in both hands, left >  right. She thinks it is present at rest and with action, worse with action. 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 She denies imbalance or falls. She denies freezing. She endorses kicking, punching, or biting in her sleep and screams as well. She denies any orthostatic hypotension.   EtOH use: very rare; has not noticed if EtOH affects tremor Restrictive diet: no, keto diet Family history of neurologic disease: Mother with dementia  11/22/23: Labs were significant for borderline low B12. I recommended B12 1000 mcg daily.  Patient is taking this   Patient mentioned in MyChart message from 10/25/23 that she felt improvement with Sinemet  after 2 days with no side effects. She states the tremors improved. The jaw tightness remained about the same. Her tremor gradually got worse after about a week of medication. There has been no significant change in symptoms.   MRI brain on 11/14/23 showed no acute process. There was some T2 hyperintensity in hypothalamic region which can be seen in Wernicke Encephalopathy. She does report some slowness in retrieval, like word finding difficulty. She does not currently having vision changes, but does report diplopia in the past. She is getting cataract surgery in the right eye next week. She thinks her balance is okay and reports no falls.   She reports no significant weight loss. She keeps a keto diet. She does not drink alcohol .  Most recent Assessment and Plan (11/22/23): [name], a 78 y.o. female with jaw tightness and tremors. She has a relevant medical history of OM of left mandible that was originally thought to be the cause of her jaw symptoms. Her neurological examination is pertinent for flat affect, hypomimia, rest tremor in LUE, abnormal finger tapping and toe tapping, reduced arm swing, and imbalance on pull testing. Per patient her jaw is tight, without paresthesias. This sounds less likely to be nerve injury, though it  is still possible. I am more concerned about possible dystonia of the jaw given that it can improve with a "sensory trick" and possibly parkinsonism. She had some initial response in terms of tremor to very low dose Sinemet  (1/2 tablet BID), but symptoms returned. MRI brain showed some T2 hyperintensity in bilateral hypothalamic area that may be consistent with thiamine  deficiency. While this is possible and I will check for today, I'm not sure that explains her jaw tightness or tremors. B12 was also found to be borderline low, which she is supplementing.   Plan: -Blood work: B1, folate, vit D -Increase Sinemet  to 1 tablet TID -Recommend B1 100 mg daily due to possible thiamine  deficiency -Continue B12 1000 mcg daily  Since their last visit: B1, folate, and vitamin D  was normal.  DaTscan  was abnormal, consistent with Parkinsonian syndrome (see full results below). I spoke with patient and mentioned that this could narrow diagnosis to Parkinson's disease or PSP. Patient was unsure if she had responded to Sinemet , so I recommended skin biopsy to differentiate PD from PSP. This was done and was positive for alpha-synuclein. I discussed with patient and explained this was consistent with Parkinson's disease. Per my telephone note from 03/04/24: She had previously been taking Sinemet  25-100 mg TID and initially felt it helped, then wasn't so sure, then felt it was causing brain fog, so she stopped it. Now she states the brain fog has returned despite being off Sinemet  several days. I explained we could do further cognitive testing (MoCA) at her next clinic visit at the end of this month, and consider neuropsych testing if there are concerns. I am not sure the etiology of this brain fog at this point.   To treat PD, I recommended we try the controlled release of Sinemet  to see if this helped her better. She agreed to try. She will take Sinemet  CR 25-100 mg TID and follow up with me as planned later this  month.  Patient took the Sinemet  CR for less than 1 week. She felt this was contributing to brain fog so stopped it. Initially her brain fog improved, but then it got worse again. She did not try going back on it.  Patient messaged on 03/19/24 asking about whether tick borne illness could cause her symptoms. She was going to discuss testing with her PCP for Lyme plus others. She tested negative for lyme and slightly positive for RMSF. She had a prior infection though.  She does mention that she is going to see integrative health in [place] in May to get their opinion on her symptoms as she values the Eastern/Western medicine approach.  Her jaw pain continues to worsen. She does not take gabapentin.   MEDICATIONS:  Outpatient Encounter Medications as of 04/02/2024  Medication Sig   conjugated estrogens  (PREMARIN ) vaginal cream Place 1 Applicatorful vaginally 2 (two) times a week. Place 0.5g nightly for two weeks then twice a week after   Carbidopa -Levodopa  ER (SINEMET  CR) 25-100  MG tablet controlled release Take 1 tablet by mouth in the morning, at noon, and at bedtime. (Patient not taking: Reported on 04/02/2024)   gabapentin (NEURONTIN) 100 MG capsule Take 100 mg by mouth 2 (two) times daily. Patient reports she takes medicine 3 times daily (Patient not taking: Reported on 04/02/2024)   No facility-administered encounter medications on file as of 04/02/2024.    PAST MEDICAL HISTORY: Past Medical History:  Diagnosis Date   History of oral surgery    Osteomyelitis (HCC)     PAST SURGICAL HISTORY: Past Surgical History:  Procedure Laterality Date   ABDOMINAL HYSTERECTOMY     CATARACT EXTRACTION Right 2024    ALLERGIES: Allergies  Allergen Reactions   Aspirin Other (See Comments)   Ibuprofen Other (See Comments) and Nausea And Vomiting   Short Ragweed Pollen Ext Rash and Other (See Comments)    FAMILY HISTORY: Family History  Problem Relation Age of Onset   Diabetes Mother     Heart disease Mother     SOCIAL HISTORY: Social History   Tobacco Use   Smoking status: Never   Smokeless tobacco: Current  Substance Use Topics   Alcohol  use: Not Currently    Comment: rarely   Drug use: Never   Social History   Social History Narrative   Are you right handed or left handed? right   Are you currently employed ?    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   Who lives with you? husband   What type of home do you live in: 1 story or 2 story? one    Caffiene none      Objective:  Vital Signs:  BP 133/78   Pulse 81   Ht 5\' 2"  (1.575 m)   Wt 131 lb (59.4 kg)   SpO2 98%   BMI 23.96 kg/m   General: No acute distress.  Patient appears well-groomed.   Head:  Normocephalic/atraumatic Neck: supple Lungs: Non-labored breathing on room air  Affect: Flat   Neurological Exam: Mental status: alert and oriented, speech fluent and not dysarthric, language intact.   Cranial nerves: CN I: not tested CN II: pupils equal, round and reactive to light, visual fields intact CN III, IV, VI:  Choppy pursuit. Restricted up and down gaze, no nystagmus, no obvious ptosis CN V: facial sensation intact. CN VII: upper and lower face symmetric. Eye closure strong CN VIII: hearing intact CN IX, X: uvula midline CN XI: sternocleidomastoid and trapezius muscles intact CN XII: tongue midline   Bulk & Tone: normal. Rest tremor appreciated in left hand (intermittent) Motor:  muscle strength 5/5 throughout Deep Tendon Reflexes:  2-3+ throughout.   Sensation:  Light touch sensation intact. Finger to nose testing:  Without dysmetria.   Coordination: Finger tapping abnormal (LUE >> RUE) and tapping taping abnormal in RLE. Pull test with mild imbalance, but improved from prior. Gait:  Normal station, reduced arm swing. No clear freezing or en bloc turning.  Labs and Imaging review: New results: 11/22/23: Vit D wnl Folate wnl B1  wnl  DaTscan  (01/25/24): FINDINGS: Marked decreased radiotracer activity within the LEFT and RIGHT putamen. Decreased relative radiotracer activity the heads of the caudate nuclei.  IMPRESSION: Significant decreased striatal Ioflupane activity as above. Pattern is typical of Parkinsonian syndrome pathology.  Previously reviewed results: 10/19/23: B12: 281 TSH wnl Vit E wnl Copper  wnl   MRI brain w/wo contrast (11/14/23): FINDINGS: Brain: Diffusion imaging does not  show any acute or subacute infarction. No abnormality affects the brainstem or cerebellum. No finding of the midbrain specific for Parkinson's disease. One could question mild increased FLAIR and T2 signal within the hypothalamic region. This may not be pathologic, but does at least raise the possibility of Wernicke Encephalopathy. Is there any clinical consideration of that? Cerebral hemispheres otherwise show only mild age related volume loss without subjective lobar predominance. Few punctate foci of T2 and FLAIR signal scattered within the white matter, less than often seen at this age in not indicative of significant small-vessel disease. No mass, hemorrhage, hydrocephalus or extra-axial collection. After contrast administration, no abnormal enhancement occurs.   Vascular: Major vessels at the base of the brain show flow.   Skull and upper cervical spine: Negative   Sinuses/Orbits: Clear/normal   Other: None   IMPRESSION: 1. Sign of acute or subacute stroke. No specific finding for Parkinson's disease. 2. One could question mild increased FLAIR and T2 signal within the hypothalamic region. This may not be pathologic, but does at least raise the possibility of Wernicke Encephalopathy. Is there any clinical consideration of that? 3. The brain does not show advanced generalized atrophy or significant small-vessel disease.    Previously reviewed results: HbA1c (12/13/2009): 5.9   External  labs: 03/20/23: CMP unremarkable CBC w/ diff unremarkable CRP wnl ESR wnl   HbA1c (01/12/23): 5.8   Imaging: MRI brain wo contrast (11/24/2006): Findings: Diffusion imaging is negative for acute or subacute stroke. There is no sign of mass, hemorrhage, hydrocephalus, or extraaxial collection. The pituitary gland is unremarkable. The paranasal sinuses, middle ears, and mastoids are clear. No skull or skull base abnormality is seen.   IMPRESSION:     1. Normal MRI of the brain.   Assessment/Plan:  [name], a 78 y.o. 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 Patient felt she had cognitive changes with Sinemet , both normal and extended release, though symptoms persist after stopping the medication. She does not appear willing to take the medication consistently enough to know if she will have a response. Cognitive changes - MoCA 25/30 today (missed 1 for slightly abnormal cube and 4 for delayed recall). She certainly seems to have a recall issue. I think she would benefit from more formal neuropsych testing to better understand her deficits. 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 Her thiamine  was normal though. B12 was also found to be borderline low, which she is supplementing.  Jaw pain and tightness - I was initially concerned about dystonia due to reports of "sensory trick". Patient has not consistent taken Sinemet , so I am unsure about this. She does have a history of OM of the left mandible, so this could also be the etiology of these symptoms.  Plan: -Neuropsych testing -Can take Sinemet  if she wants, currently prescribed Sinemet  CR 1 tablet TID -PD packet given today with resources and information -Fall precautions discussed -Continue B12  1000 mcg daily -Continue B1 100 mg daily  Return to clinic in 3-4 months  Total time spent reviewing records, interview, history/exam, documentation, and coordination of care on day of encounter:  45 min  Rommie Coats, MD

## 2024-04-02 ENCOUNTER — Encounter: Payer: Self-pay | Admitting: Neurology

## 2024-04-02 ENCOUNTER — Ambulatory Visit: Payer: Medicare HMO | Admitting: Neurology

## 2024-04-02 VITALS — BP 133/78 | HR 81 | Ht 62.0 in | Wt 131.0 lb

## 2024-04-02 DIAGNOSIS — R251 Tremor, unspecified: Secondary | ICD-10-CM

## 2024-04-02 DIAGNOSIS — R6884 Jaw pain: Secondary | ICD-10-CM

## 2024-04-02 DIAGNOSIS — R258 Other abnormal involuntary movements: Secondary | ICD-10-CM

## 2024-04-02 DIAGNOSIS — G20A1 Parkinson's disease without dyskinesia, without mention of fluctuations: Secondary | ICD-10-CM

## 2024-04-02 DIAGNOSIS — R930 Abnormal findings on diagnostic imaging of skull and head, not elsewhere classified: Secondary | ICD-10-CM

## 2024-04-02 DIAGNOSIS — R4189 Other symptoms and signs involving cognitive functions and awareness: Secondary | ICD-10-CM

## 2024-04-02 DIAGNOSIS — E538 Deficiency of other specified B group vitamins: Secondary | ICD-10-CM

## 2024-04-02 NOTE — Patient Instructions (Addendum)
 I will order neuropsych testing to better evaluate your memory changes.   I would recommend you take the Sinemet  1 tablet 3 times daily, as you have not given it a good enough shot yet to know if it will work.  I am giving you a parkinson's disease packet today with resources.  I will see you back in clinic in 3-4 months. Please let me know if you have any questions or concerns in the meantime.   The physicians and staff at [facility] are committed to providing excellent care. You may receive a survey requesting feedback about your experience at our office. We strive to receive "very good" responses to the survey questions. If you feel that your experience would prevent you from giving the office a "very good " response, please contact our office to try to remedy the situation. We may be reached at [phone]. Thank you for taking the time out of your busy day to complete the survey.  Rommie Coats, MD [facility]  Preventing Falls at [facility] are common, often dreaded events in the lives of older people. Aside from the obvious injuries and even death that may result, fall can cause wide-ranging consequences including loss of independence, mental decline, decreased activity and mobility. Younger people are also at risk of falling, especially those with chronic illnesses and fatigue.  Ways to reduce risk for falling Examine diet and medications. Warm foods and alcohol  dilate blood vessels, which can lead to dizziness when standing. Sleep aids, antidepressants and pain medications can also increase the likelihood of a fall.  Get a vision exam. Poor vision, cataracts and glaucoma increase the chances of falling.  Check foot gear. Shoes should fit snugly and have a sturdy, nonskid sole and a broad, low heel  Participate in a physician-approved exercise program to build and maintain muscle strength and improve balance and coordination. Programs that use ankle weights or stretch  bands are excellent for muscle-strengthening. Water aerobics programs and low-impact Tai Chi programs have also been shown to improve balance and coordination.  Increase vitamin D  intake. Vitamin D  improves muscle strength and increases the amount of calcium the body is able to absorb and deposit in bones.  How to prevent falls from common hazards Floors - Remove all loose wires, cords, and throw rugs. Minimize clutter. Make sure rugs are anchored and smooth. Keep furniture in its usual place.  Chairs -- Use chairs with straight backs, armrests and firm seats. Add firm cushions to existing pieces to add height.  Bathroom - Install grab bars and non-skid tape in the tub or shower. Use a bathtub transfer bench or a shower chair with a back support Use an elevated toilet seat and/or safety rails to assist standing from a low surface. Do not use towel racks or bathroom tissue holders to help you stand.  Lighting - Make sure halls, stairways, and entrances are well-lit. Install a night light in your bathroom or hallway. Make sure there is a light switch at the top and bottom of the staircase. Turn lights on if you get up in the middle of the night. Make sure lamps or light switches are within reach of the bed if you have to get up during the night.  Kitchen - Install non-skid rubber mats near the sink and stove. Clean spills immediately. Store frequently used utensils, pots, pans between waist and eye level. This helps prevent reaching and bending. Sit when getting things out of lower cupboards.  Living room/  Bedrooms - Place furniture with wide spaces in between, giving enough room to move around. Establish a route through the living room that gives you something to hold onto as you walk.  Stairs - Make sure treads, rails, and rugs are secure. Install a rail on both sides of the stairs. If stairs are a threat, it might be helpful to arrange most of your activities on the lower level to reduce the number  of times you must climb the stairs.  Entrances and doorways - Install metal handles on the walls adjacent to the doorknobs of all doors to make it more secure as you travel through the doorway.  Tips for maintaining balance Keep at least one hand free at all times. Try using a backpack or fanny pack to hold things rather than carrying them in your hands. Never carry objects in both hands when walking as this interferes with keeping your balance.  Attempt to swing both arms from front to back while walking. This might require a conscious effort if Parkinson's disease has diminished your movement. It will, however, help you to maintain balance and posture, and reduce fatigue.  Consciously lift your feet off of the ground when walking. Shuffling and dragging of the feet is a common culprit in losing your balance.  When trying to navigate turns, use a "U" technique of facing forward and making a wide turn, rather than pivoting sharply.  Try to stand with your feet shoulder-length apart. When your feet are close together for any length of time, you increase your risk of losing your balance and falling.  Do one thing at a time. Don't try to walk and accomplish another task, such as reading or looking around. The decrease in your automatic reflexes complicates motor function, so the less distraction, the better.  Do not wear rubber or gripping soled shoes, they might "catch" on the floor and cause tripping.  Move slowly when changing positions. Use deliberate, concentrated movements and, if needed, use a grab bar or walking aid. Count 15 seconds between each movement. For example, when rising from a seated position, wait 15 seconds after standing to begin walking.  If balance is a continuous problem, you might want to consider a walking aid such as a cane, walking stick, or walker. Once you've mastered walking with help, you might be ready to try it on your own again.

## 2024-05-01 ENCOUNTER — Ambulatory Visit: Payer: Medicare HMO | Admitting: Obstetrics and Gynecology

## 2024-05-01 ENCOUNTER — Encounter: Payer: Self-pay | Admitting: Obstetrics and Gynecology

## 2024-05-01 VITALS — BP 117/67 | HR 84

## 2024-05-01 DIAGNOSIS — N993 Prolapse of vaginal vault after hysterectomy: Secondary | ICD-10-CM

## 2024-05-01 DIAGNOSIS — N8110 Cystocele, unspecified: Secondary | ICD-10-CM

## 2024-05-01 DIAGNOSIS — N813 Complete uterovaginal prolapse: Secondary | ICD-10-CM | POA: Diagnosis not present

## 2024-05-01 DIAGNOSIS — N952 Postmenopausal atrophic vaginitis: Secondary | ICD-10-CM

## 2024-05-01 DIAGNOSIS — N816 Rectocele: Secondary | ICD-10-CM

## 2024-05-01 NOTE — Progress Notes (Signed)
 [facility]   Subjective:      Chief Complaint:  Chief Complaint  Patient presents with   Pessary Check   History of Present Illness: Sara Mason is a 78 y.o. female with stage III pelvic organ prolapse who presents for a pessary check. She is using a size #2 donut pessary. The pessary has been working well and she has no complaints. She is using vaginal estrogen. She endorses discharge and one small spot of blood recently.   Past Medical History: Patient  has a past medical history of History of oral surgery and Osteomyelitis (HCC).   Past Surgical History: She  has a past surgical history that includes Abdominal hysterectomy and Cataract extraction (Right, 2024).   Medications: She has a current medication list which includes the following prescription(s): carbidopa -levodopa  er, conjugated estrogens , and gabapentin.   Allergies: Patient is allergic to aspirin, ibuprofen, and short ragweed pollen ext.   Social History: Patient  reports that she has never smoked. She uses smokeless tobacco. She reports that she does not currently use alcohol . She reports that she does not use drugs.      Objective:     Physical Exam: BP 117/67 (BP Location: Left Arm, Patient Position: Standing, Cuff Size: Small)   Pulse 84   SpO2 100%  Gen: No apparent distress, A&O x 3. Detailed Urogynecologic Evaluation:  Pelvic Exam: Normal external female genitalia; Bartholin's and Skene's glands normal in appearance; urethral meatus with caruncle, no urethral masses or discharge. The pessary was noted to be in place. It was removed and cleaned. Speculum exam revealed mild excoriation in the vagina. There was a small area of irritation at the apex. This was treated with silver nitrate. Patient was encouraged to use vaginal estrogen cream nightly for two weeks. Pessary was changed to a #5 Ring with support with knob ([id number]) as she was endorsing more leakage. Her Gh had decreased in  size with pessary support and the Ring with knob was comfortable to the patient, fit well, and she was able to urinate without difficulty.     Assessment/Plan:    Assessment: Sara Mason is a 78 y.o. with stage III pelvic organ prolapse here for a pessary check. She is doing well.  Plan: She will keep the pessary in place until next visit. She will continue to use estrogen. She will follow-up in 4 weeks for a pessary check or sooner as needed.

## 2024-05-26 ENCOUNTER — Encounter: Payer: Self-pay | Admitting: Obstetrics and Gynecology

## 2024-05-26 ENCOUNTER — Ambulatory Visit: Admitting: Obstetrics and Gynecology

## 2024-05-26 VITALS — BP 124/81 | HR 78

## 2024-05-26 DIAGNOSIS — N8110 Cystocele, unspecified: Secondary | ICD-10-CM

## 2024-05-26 DIAGNOSIS — N952 Postmenopausal atrophic vaginitis: Secondary | ICD-10-CM

## 2024-05-26 DIAGNOSIS — N816 Rectocele: Secondary | ICD-10-CM

## 2024-05-26 DIAGNOSIS — N813 Complete uterovaginal prolapse: Secondary | ICD-10-CM

## 2024-05-26 DIAGNOSIS — N993 Prolapse of vaginal vault after hysterectomy: Secondary | ICD-10-CM

## 2024-05-26 NOTE — Patient Instructions (Signed)
 Please call if the irritation increased or your pessary comes out.

## 2024-05-26 NOTE — Progress Notes (Signed)
 [facility]   Subjective:      Chief Complaint:  Chief Complaint  Patient presents with   Follow-up    Sara Mason is a 78 y.o. female here today pessary check.   History of Present Illness: Sara Mason is a 78 y.o. female with stage III pelvic organ prolapse who presents for a pessary check. She is using a size #4 incontinence ring with support pessary. The pessary has been working well and she has no complaints. She is using vaginal estrogen. She endorses some spotting of blood after initial pessary change, but none since.   Past Medical History: Patient  has a past medical history of History of oral surgery and Osteomyelitis (HCC).   Past Surgical History: She  has a past surgical history that includes Abdominal hysterectomy and Cataract extraction (Right, 2024).   Medications: She has a current medication list which includes the following prescription(s): carbidopa -levodopa  er, conjugated estrogens , and gabapentin.   Allergies: Patient is allergic to aspirin, ibuprofen, and short ragweed pollen ext.   Social History: Patient  reports that she has never smoked. She has never used smokeless tobacco. She reports that she does not currently use alcohol . She reports that she does not use drugs.      Objective:     Physical Exam: BP 124/81   Pulse 78  Gen: No apparent distress, A&O x 3. Detailed Urogynecologic Evaluation:  Pelvic Exam: Normal external female genitalia; Bartholin's and Skene's glands normal in appearance; urethral meatus with caruncle, no urethral masses or discharge. The pessary was noted to be in place. It was removed and cleaned. Speculum exam revealed mild excoriation in the vagina. There was a small area of irritation at the apex. This was treated with silver nitrate. Pessary was changed to a #2 Ring with support with knob ([id number]) as she reports she was having spotting. This stayed in place with valsalva and urination and she  reports it is comfortable.     Assessment/Plan:    Assessment: Ms. Mcintire is a 78 y.o. with stage III pelvic organ prolapse here for a pessary check. She is doing well.  Plan: She will keep the pessary in place until next visit. She will continue to use estrogen. She will follow-up in 8 weeks for a pessary check or sooner as needed.

## 2024-07-30 DIAGNOSIS — R6884 Jaw pain: Secondary | ICD-10-CM | POA: Diagnosis not present

## 2024-07-30 DIAGNOSIS — G20A1 Parkinson's disease without dyskinesia, without mention of fluctuations: Secondary | ICD-10-CM | POA: Diagnosis not present

## 2024-07-30 DIAGNOSIS — R109 Unspecified abdominal pain: Secondary | ICD-10-CM | POA: Diagnosis not present

## 2024-07-30 DIAGNOSIS — R7302 Impaired glucose tolerance (oral): Secondary | ICD-10-CM | POA: Diagnosis not present

## 2024-08-01 ENCOUNTER — Ambulatory Visit: Admitting: Obstetrics and Gynecology

## 2024-08-01 VITALS — BP 124/74 | HR 76

## 2024-08-01 DIAGNOSIS — G20A1 Parkinson's disease without dyskinesia, without mention of fluctuations: Secondary | ICD-10-CM

## 2024-08-01 DIAGNOSIS — Z960 Presence of urogenital implants: Secondary | ICD-10-CM

## 2024-08-01 DIAGNOSIS — N993 Prolapse of vaginal vault after hysterectomy: Secondary | ICD-10-CM

## 2024-08-01 DIAGNOSIS — N8110 Cystocele, unspecified: Secondary | ICD-10-CM

## 2024-08-01 DIAGNOSIS — N813 Complete uterovaginal prolapse: Secondary | ICD-10-CM

## 2024-08-01 DIAGNOSIS — K5901 Slow transit constipation: Secondary | ICD-10-CM

## 2024-08-01 DIAGNOSIS — N816 Rectocele: Secondary | ICD-10-CM

## 2024-08-01 NOTE — Progress Notes (Signed)
 [facility]   Subjective:     Chief Complaint: Pessary Check Sara Mason is a 78 y.o. female is here for pessary check)  History of Present Illness: Sara Mason is a 78 y.o. female with stage II pelvic organ prolapse who presents today for a pessary fitting. She was wearing a #2 incontinence ring with support which fell out yesterday. She reports she had been having to push it back in during harder bowel movements. She is using vaginal estrogen cream x3 weekly.    Past Medical History: Patient  has a past medical history of History of oral surgery and Osteomyelitis (HCC).   Past Surgical History: She  has a past surgical history that includes Abdominal hysterectomy and Cataract extraction (Right, 2024).   Medications: She has a current medication list which includes the following prescription(s): carbidopa -levodopa  er, conjugated estrogens , and gabapentin.   Allergies: Patient is allergic to aspirin, ibuprofen, and short ragweed pollen ext.   Social History: Patient  reports that she has never smoked. She has never used smokeless tobacco. She reports that she does not currently use alcohol . She reports that she does not use drugs.      Objective:    BP 124/74   Pulse 76  Gen: No apparent distress, A&O x 3. Pelvic Exam: Normal external female genitalia; Bartholin's and Skene's glands normal in appearance; urethral meatus with caruncle, no urethral masses or discharge.   A size #3 incontinence ring with support pessary was fitted. It was comfortable, stayed in place with valsalva and was an appropriate size on examination, with one finger fitting between the pessary and the vaginal walls. Patient has progressing parkinson's, so we have been assisting in her pessary management.     Assessment/Plan:    Assessment: Sara Mason is a 78 y.o. with stage III pelvic organ prolapse who presents for a pessary fitting. Plan: She was fitted with a #3  incontinence ring with support pessary. She will keep the pessary in place until next visit. She will use estrogen.   Follow-up in 3 months for a pessary check or sooner as needed.  All questions were answered.    Connee Ikner G Zeola Brys, NP

## 2024-08-06 ENCOUNTER — Ambulatory Visit: Admitting: Neurology

## 2024-08-06 ENCOUNTER — Other Ambulatory Visit: Payer: Self-pay | Admitting: Internal Medicine

## 2024-08-06 DIAGNOSIS — R1012 Left upper quadrant pain: Secondary | ICD-10-CM

## 2024-08-06 DIAGNOSIS — R52 Pain, unspecified: Secondary | ICD-10-CM

## 2024-08-06 DIAGNOSIS — R1011 Right upper quadrant pain: Secondary | ICD-10-CM

## 2024-08-11 ENCOUNTER — Ambulatory Visit: Payer: Self-pay

## 2024-08-11 ENCOUNTER — Institutional Professional Consult (permissible substitution): Admitting: Psychology

## 2024-08-11 ENCOUNTER — Ambulatory Visit
Admission: RE | Admit: 2024-08-11 | Discharge: 2024-08-11 | Disposition: A | Discharge status: Home / Self Care | Source: Ambulatory Visit | Attending: Internal Medicine | Admitting: Internal Medicine

## 2024-08-11 DIAGNOSIS — R52 Pain, unspecified: Secondary | ICD-10-CM

## 2024-08-11 DIAGNOSIS — R1012 Left upper quadrant pain: Secondary | ICD-10-CM

## 2024-08-11 DIAGNOSIS — R1010 Upper abdominal pain, unspecified: Secondary | ICD-10-CM | POA: Diagnosis not present

## 2024-08-11 DIAGNOSIS — R1011 Right upper quadrant pain: Secondary | ICD-10-CM

## 2024-08-11 MED ORDER — IOPAMIDOL (ISOVUE-300) INJECTION 61%
100.0000 mL | Freq: Once | INTRAVENOUS | Status: AC | PRN
  Administered 2024-08-11: 100 mL via INTRAVENOUS

## 2024-08-18 ENCOUNTER — Encounter: Admitting: Psychology

## 2024-08-21 NOTE — Progress Notes (Signed)
 NEUROLOGY FOLLOW UP OFFICE NOTE  Sara Mason [id number]  Subjective:  Sara Mason is a 78 y.o. year old right handed female with a history of OM of left mandible who we last saw on 04/02/24 for jaw tightness, tremor, and parkinsonism.  To briefly review: 10/19/23: In 07/2022 patient started getting a tightness in her jaw (above and below lips). It does not hurt. It was infrequent at first. It has increased in frequency and intensity over the past year. She describes that she feels like there is a magnetic force bring her teeth together. She can touch anywhere around the lips to relieve symptoms. She can also put her tongue on her teeth or roof of her mouth to relieve symptoms. If she stops this though, it comes right back. It happens multiple times per day, before medication, happening 80% of the time. She was put on gabapentin 100 mg TID on 09/08/23. Gabapentin has reduced the frequency and intensity of symptoms. She only has symptoms about 40% of the time now. It does not bother her in her sleep or when she wakes up at night to go to the bathroom or when she first wakes up. She does not see any triggers or patterns to symptoms.   She denies any numbness or tingling of her face.   She has a lot of jaw and dental issues and has been seen by [facility] dental, oral surgery, and infectious disease. 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 She completed 3 months of antibiotics in 04/2023. ID felt her tightness and paresthesias may be 2/2 minor nerve injury during oral surgery.   She had a CT of her jaw on a few weeks ago. Per patient report, it shows a foreign object (possible dental Bur, surgical drill from implant placement). Per patient this could be removed, but it is not thought to be causing her symptoms.   Patient has not had good smell for 30-40 years. She has tremor in both hands, left >  right. She thinks it is present at rest and with action, worse with action. 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 She denies imbalance or falls. She denies freezing. She endorses kicking, punching, or biting in her sleep and screams as well. She denies any orthostatic hypotension.   EtOH use: very rare; has not noticed if EtOH affects tremor Restrictive diet: no, keto diet Family history of neurologic disease: Mother with dementia   11/22/23: Labs were significant for borderline low B12. I recommended B12 1000 mcg daily.  Patient is taking this   Patient mentioned in MyChart message from 10/25/23 that she felt improvement with Sinemet  after 2 days with no side effects. She states the tremors improved. The jaw tightness remained about the same. Her tremor gradually got worse after about a week of medication. There has been no significant change in symptoms.   MRI brain on 11/14/23 showed no acute process. There was some T2 hyperintensity in hypothalamic region which can be seen in Wernicke Encephalopathy. She does report some slowness in retrieval, like word finding difficulty. She does not currently having vision changes, but does report diplopia in the past. She is getting cataract surgery in the right eye next week. She thinks her balance is okay and reports no falls.   She reports no significant weight loss. She keeps a keto diet. She does not drink  alcohol .  I increased Sinemet  to 1 tablet TID and recommended B1 100 mg daily on 11/22/23.  04/02/24: B1, folate, and vitamin D  was normal.   DaTscan  was abnormal, consistent with Parkinsonian syndrome (see full results below). I spoke with patient and mentioned that this could narrow diagnosis to Parkinson's disease or PSP. Patient was unsure if she had responded to Sinemet , so I recommended skin biopsy to differentiate PD from PSP. This was done and was positive for alpha-synuclein. I discussed with patient and explained this  was consistent with Parkinson's disease. Per my telephone note from 03/04/24: She had previously been taking Sinemet  25-100 mg TID and initially felt it helped, then wasn't so sure, then felt it was causing brain fog, so she stopped it. Now she states the brain fog has returned despite being off Sinemet  several days. I explained we could do further cognitive testing (MoCA) at her next clinic visit at the end of this month, and consider neuropsych testing if there are concerns. I am not sure the etiology of this brain fog at this point.   To treat PD, I recommended we try the controlled release of Sinemet  to see if this helped her better. She agreed to try. She will take Sinemet  CR 25-100 mg TID and follow up with me as planned later this month.   Patient took the Sinemet  CR for less than 1 week. She felt this was contributing to brain fog so stopped it. Initially her brain fog improved, but then it got worse again. She did not try going back on it.   Patient messaged on 03/19/24 asking about whether tick borne illness could cause her symptoms. She was going to discuss testing with her PCP for Lyme plus others. She tested negative for lyme and slightly positive for RMSF. She had a prior infection though.   She does mention that she is going to see integrative health in [place] in May to get their opinion on her symptoms as she values the Eastern/Western medicine approach.   Her jaw pain continues to worsen. She does not take gabapentin.  Most recent Assessment and Plan (04/02/24): This is [name], a 78 y.o. 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 Patient felt she had cognitive changes with Sinemet , both normal and extended release, though symptoms persist after stopping the medication. She does not appear willing to take the medication  consistently enough to know if she will have a response. Cognitive changes - MoCA 25/30 today (missed 1 for slightly abnormal cube and 4 for delayed recall). She certainly seems to have a recall issue. I think she would benefit from more formal neuropsych testing to better understand her deficits. 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 Her thiamine  was normal though. B12 was also found to be borderline low, which she is supplementing.  Jaw pain and tightness - I was initially concerned about dystonia due to reports of sensory trick. Patient has not consistent taken Sinemet , so I am unsure about this. She does have a history of OM of the left mandible, so this could also be the etiology of these symptoms.   Plan: -Neuropsych testing -Can take Sinemet  if she wants, currently prescribed Sinemet  CR 1 tablet TID -PD packet given today with resources and information -Fall precautions discussed -Continue B12 1000 mcg daily -  Continue B1 100 mg daily  Since their last visit: Patient feels she is about the same. She still moves slowly. She has occasional imbalance, but no falls. Jaw tightness and pain is also same as prior. It does not occur always, but sometimes. She just ignores it currently.  She continues to take Sinemet  CR 1 tablet TID. She does not notice a clear difference with the medication.  She did not schedule neuropsych testing because she felt the brain fog would prevent her from doing well. This continues to be distressing her. She would like to continue to think about it and will message me is she wants to do neuropsych testing.  Regarding her sleep, she falls asleep quickly, but wakes up after 5-6 hours. She has difficulty falling back asleep. She is tired when wakes and is tired during the day. She says her husband says she snores.  She has no new complaints.  MEDICATIONS:  Outpatient  Encounter Medications as of 08/29/2024  Medication Sig   Carbidopa -Levodopa  ER (SINEMET  CR) 25-100 MG tablet controlled release Take 1 tablet by mouth in the morning, at noon, and at bedtime.   conjugated estrogens  (PREMARIN ) vaginal cream Place 1 Applicatorful vaginally 2 (two) times a week. Place 0.5g nightly for two weeks then twice a week after   gabapentin (NEURONTIN) 100 MG capsule Take 100 mg by mouth 2 (two) times daily. Patient reports she takes medicine 3 times daily (Patient not taking: Reported on 08/29/2024)   No facility-administered encounter medications on file as of 08/29/2024.    PAST MEDICAL HISTORY: Past Medical History:  Diagnosis Date   History of oral surgery    Osteomyelitis (HCC)     PAST SURGICAL HISTORY: Past Surgical History:  Procedure Laterality Date   ABDOMINAL HYSTERECTOMY     CATARACT EXTRACTION Right 2024    ALLERGIES: Allergies  Allergen Reactions   Aspirin Other (See Comments)   Ibuprofen Other (See Comments) and Nausea And Vomiting   Short Ragweed Pollen Ext Rash and Other (See Comments)    FAMILY HISTORY: Family History  Problem Relation Age of Onset   Diabetes Mother    Heart disease Mother    Bladder Cancer Neg Hx    Uterine cancer Neg Hx    Kidney cancer Neg Hx     SOCIAL HISTORY: Social History   Tobacco Use   Smoking status: Never   Smokeless tobacco: Never  Vaping Use   Vaping status: Never Used  Substance Use Topics   Alcohol  use: Not Currently    Comment: rarely   Drug use: Never   Social History   Social History Narrative   Are you right handed or left handed? right   Are you currently employed ?    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   Who lives with you? husband   What type of home do you live in: 1 story or 2 story? one    Caffiene none      Objective:  Vital Signs:  BP 136/82   Pulse 84   Ht 5' 2 (1.575 m)   Wt 131 lb (59.4 kg)   SpO2 99%   BMI 23.96 kg/m    General: No acute distress.  Patient appears well-groomed.   Head:  Normocephalic/atraumatic Neck: supple Heart: regular rate and rhythm Lungs: Clear to auscultation bilaterally. Vascular: No carotid bruits.  Neurological Exam: Mental status: alert and oriented, speech fluent and not dysarthric, language intact.  Cranial nerves: CN I: not tested CN II: pupils equal, round and reactive to light, visual fields intact CN III, IV, VI:  full range of motion, no nystagmus, no ptosis CN V: facial sensation intact. CN VII: upper and lower face symmetric CN VIII: hearing intact CN IX, X: uvula midline CN XI: sternocleidomastoid and trapezius muscles intact CN XII: tongue midline  Bulk & Tone: normal. Rest tremor in left hand. Motor:  muscle strength 5/5 throughout Deep Tendon Reflexes:  2-3+ throughout   Sensation:  Light touch sensation intact. Finger to nose testing:  Without dysmetria.   Coordination: Finger tapping abnormal (LUE >> RUE), toe tapping normal today. Pull test with imbalance and multiple steps backward. Gait:  Normal station and stride. Reduced arm swing. No clear freezing or difficulty with turns.   Labs and Imaging review: No new results  Previously reviewed results: 11/22/23: Vit D wnl Folate wnl B1 wnl   DaTscan  (01/25/24): FINDINGS: Marked decreased radiotracer activity within the LEFT and RIGHT putamen. Decreased relative radiotracer activity the heads of the caudate nuclei.   IMPRESSION: Significant decreased striatal Ioflupane activity as above. Pattern is typical of Parkinsonian syndrome pathology.   Previously reviewed results: 10/19/23: B12: 281 TSH wnl Vit E wnl Copper  wnl   HbA1c (12/13/2009): 5.9   External labs: 03/20/23: CMP unremarkable CBC w/ diff unremarkable CRP wnl ESR wnl   HbA1c (01/12/23): 5.8   Imaging: MRI brain wo contrast (11/24/2006): Findings: Diffusion imaging is negative for acute or subacute stroke. There is  no sign of mass, hemorrhage, hydrocephalus, or extraaxial collection. The pituitary gland is unremarkable. The paranasal sinuses, middle ears, and mastoids are clear. No skull or skull base abnormality is seen.   IMPRESSION:     1. Normal MRI of the brain.   MRI brain w/wo contrast (11/14/23): FINDINGS: Brain: Diffusion imaging does not show any acute or subacute infarction. No abnormality affects the brainstem or cerebellum. No finding of the midbrain specific for Parkinson's disease. One could question mild increased FLAIR and T2 signal within the hypothalamic region. This may not be pathologic, but does at least raise the possibility of Wernicke Encephalopathy. Is there any clinical consideration of that? Cerebral hemispheres otherwise show only mild age related volume loss without subjective lobar predominance. Few punctate foci of T2 and FLAIR signal scattered within the white matter, less than often seen at this age in not indicative of significant small-vessel disease. No mass, hemorrhage, hydrocephalus or extra-axial collection. After contrast administration, no abnormal enhancement occurs.   Vascular: Major vessels at the base of the brain show flow.   Skull and upper cervical spine: Negative   Sinuses/Orbits: Clear/normal   Other: None   IMPRESSION: 1. Sign of acute or subacute stroke. No specific finding for Parkinson's disease. 2. One could question mild increased FLAIR and T2 signal within the hypothalamic region. This may not be pathologic, but does at least raise the possibility of Wernicke Encephalopathy. Is there any clinical consideration of that? 3. The brain does not show advanced generalized atrophy or significant small-vessel disease.   Assessment/Plan:  [name], a 78 y.o. 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 Patient felt she had cognitive changes with Sinemet , both normal and extended release, though symptoms persist after stopping the medication. At previous appointment I may have misunderstood that patient was not taking medication, but she has been taking  Sinemet  CR 1 tablet TID without clear benefit thus far. Cognitive changes - MoCA 25/30 on 04/02/24 (missed 1 for slightly abnormal cube and 4 for delayed recall). I previously recommended neuropsych testing, but patient did not get it done or know if she wants to. 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 Her thiamine  was normal though. B12 was also found to be borderline low, which she is supplementing.  She also mentions poor sleep and snoring (?OSA vs insomnia). This could be contributing to poor cognition/brain fog. Jaw pain and tightness - I was initially concerned about dystonia due to reports of sensory trick. She does have a history of OM of the left mandible, so this could also be the etiology of these symptoms.   Plan: -Discussed neuropsych testing. She would like to continue to think about it and will message me if she wants to do neuropsych testing. -Sleep medicine referral  -Will increase Sinemet  CR. Will take 2 tablets at 7am, 1 tablet at 2 pm, 1 tablet at 9 pm for 1 week, then 2 tablets at 7 am, 2 tablets at 2 pm, 1 tablet at 9 pm for 1 week, then 2 tablets three times daily thereafter -Discussed staying active -Fall precautions discussed  Return to clinic in 4-5 months  Total time spent reviewing records, interview, history/exam, documentation, and coordination of care on day of encounter:  45 min  Venetia Potters, MD

## 2024-08-29 ENCOUNTER — Ambulatory Visit: Admitting: Neurology

## 2024-08-29 ENCOUNTER — Encounter: Payer: Self-pay | Admitting: Neurology

## 2024-08-29 VITALS — BP 136/82 | HR 84 | Ht 62.0 in | Wt 131.0 lb

## 2024-08-29 DIAGNOSIS — Z72820 Sleep deprivation: Secondary | ICD-10-CM | POA: Diagnosis not present

## 2024-08-29 DIAGNOSIS — R0683 Snoring: Secondary | ICD-10-CM

## 2024-08-29 DIAGNOSIS — R6884 Jaw pain: Secondary | ICD-10-CM

## 2024-08-29 DIAGNOSIS — G249 Dystonia, unspecified: Secondary | ICD-10-CM | POA: Diagnosis not present

## 2024-08-29 DIAGNOSIS — E538 Deficiency of other specified B group vitamins: Secondary | ICD-10-CM

## 2024-08-29 DIAGNOSIS — R930 Abnormal findings on diagnostic imaging of skull and head, not elsewhere classified: Secondary | ICD-10-CM | POA: Diagnosis not present

## 2024-08-29 DIAGNOSIS — G20A1 Parkinson's disease without dyskinesia, without mention of fluctuations: Secondary | ICD-10-CM | POA: Diagnosis not present

## 2024-08-29 MED ORDER — CARBIDOPA-LEVODOPA ER 25-100 MG PO TBCR: 2.0000 | EXTENDED_RELEASE_TABLET | Freq: Three times a day (TID) | ORAL | 5 refills | Status: AC

## 2024-08-29 NOTE — Addendum Note (Signed)
 Addended by: TERRIL CHARLIES MATSU on: 08/29/2024 11:23 AM   Modules accepted: Orders

## 2024-08-29 NOTE — Patient Instructions (Addendum)
 More information about neuropsych testing: [email or web address]  -Discussed neuropsych testing. You would like to continue to think about it and will message me if you want to do neuropsych testing.  -Sleep medicine referral   -Will increase Sinemet  CR. Will take 2 tablets at 7am, 1 tablet at 2 pm, 1 tablet at 9 pm for 1 week, then 2 tablets at 7 am, 2 tablets at 2 pm, 1 tablet at 9 pm for 1 week, then 2 tablets three times daily thereafter  -Discussed staying active  Return to clinic in 4-5 months  Please let me know if you have any questions or concerns in the meantime.  The physicians and staff at [facility] are committed to providing excellent care. You may receive a survey requesting feedback about your experience at our office. We strive to receive very good responses to the survey questions. If you feel that your experience would prevent you from giving the office a very good  response, please contact our office to try to remedy the situation. We may be reached at [phone]. Thank you for taking the time out of your busy day to complete the survey.  Venetia Potters, MD [facility]  Preventing Falls at [facility] are common, often dreaded events in the lives of older people. Aside from the obvious injuries and even death that may result, fall can cause wide-ranging consequences including loss of independence, mental decline, decreased activity and mobility. Younger people are also at risk of falling, especially those with chronic illnesses and fatigue.  Ways to reduce risk for falling Examine diet and medications. Warm foods and alcohol  dilate blood vessels, which can lead to dizziness when standing. Sleep aids, antidepressants and pain medications can also increase the likelihood of a fall.  Get a vision exam. Poor vision, cataracts and glaucoma increase the chances of falling.  Check foot  gear. Shoes should fit snugly and have a sturdy, nonskid sole and a broad, low heel  Participate in a physician-approved exercise program to build and maintain muscle strength and improve balance and coordination. Programs that use ankle weights or stretch bands are excellent for muscle-strengthening. Water aerobics programs and low-impact Tai Chi programs have also been shown to improve balance and coordination.  Increase vitamin D  intake. Vitamin D  improves muscle strength and increases the amount of calcium the body is able to absorb and deposit in bones.  How to prevent falls from common hazards Floors - Remove all loose wires, cords, and throw rugs. Minimize clutter. Make sure rugs are anchored and smooth. Keep furniture in its usual place.  Chairs -- Use chairs with straight backs, armrests and firm seats. Add firm cushions to existing pieces to add height.  Bathroom - Install grab bars and non-skid tape in the tub or shower. Use a bathtub transfer bench or a shower chair with a back support Use an elevated toilet seat and/or safety rails to assist standing from a low surface. Do not use towel racks or bathroom tissue holders to help you stand.  Lighting - Make sure halls, stairways, and entrances are well-lit. Install a night light in your bathroom or hallway. Make sure there is a light switch at the top and bottom of the staircase. Turn lights on if you get up in the middle of the night. Make sure lamps or light switches are within reach of the bed if you have to get up during the night.  Kitchen - Install non-skid rubber mats near the sink and stove.  Clean spills immediately. Store frequently used utensils, pots, pans between waist and eye level. This helps prevent reaching and bending. Sit when getting things out of lower cupboards.  Living room/ Bedrooms - Place furniture with wide spaces in between, giving enough room to move around. Establish a route through the living room that gives  you something to hold onto as you walk.  Stairs - Make sure treads, rails, and rugs are secure. Install a rail on both sides of the stairs. If stairs are a threat, it might be helpful to arrange most of your activities on the lower level to reduce the number of times you must climb the stairs.  Entrances and doorways - Install metal handles on the walls adjacent to the doorknobs of all doors to make it more secure as you travel through the doorway.  Tips for maintaining balance Keep at least one hand free at all times. Try using a backpack or fanny pack to hold things rather than carrying them in your hands. Never carry objects in both hands when walking as this interferes with keeping your balance.  Attempt to swing both arms from front to back while walking. This might require a conscious effort if Parkinson's disease has diminished your movement. It will, however, help you to maintain balance and posture, and reduce fatigue.  Consciously lift your feet off of the ground when walking. Shuffling and dragging of the feet is a common culprit in losing your balance.  When trying to navigate turns, use a U technique of facing forward and making a wide turn, rather than pivoting sharply.  Try to stand with your feet shoulder-length apart. When your feet are close together for any length of time, you increase your risk of losing your balance and falling.  Do one thing at a time. Don't try to walk and accomplish another task, such as reading or looking around. The decrease in your automatic reflexes complicates motor function, so the less distraction, the better.  Do not wear rubber or gripping soled shoes, they might catch on the floor and cause tripping.  Move slowly when changing positions. Use deliberate, concentrated movements and, if needed, use a grab bar or walking aid. Count 15 seconds between each movement. For example, when rising from a seated position, wait 15 seconds after standing  to begin walking.  If balance is a continuous problem, you might want to consider a walking aid such as a cane, walking stick, or walker. Once you've mastered walking with help, you might be ready to try it on your own again.

## 2024-09-02 ENCOUNTER — Encounter: Payer: Self-pay | Admitting: Neurology

## 2024-09-16 ENCOUNTER — Telehealth: Payer: Self-pay | Admitting: Neurology

## 2024-09-16 NOTE — Telephone Encounter (Signed)
 Stacy with [employer] called and LM with AN. Call [phone] ext [phone]  They received medical records for pt. They are requesting an appeal for a denied authorization

## 2024-09-17 ENCOUNTER — Telehealth: Payer: Self-pay

## 2024-09-17 NOTE — Telephone Encounter (Signed)
 Called [employer] to talk to Sara Mason was not able to speak with her left message to call office back.

## 2024-09-17 NOTE — Telephone Encounter (Signed)
 Called Stacy at [facility] back to inquire what this is concerning. We have not received any denied serbia

## 2024-09-19 NOTE — Telephone Encounter (Signed)
 Called [employer] back and Harlene wanted the Doctors name that returned the appeal. All questions were answered.

## 2024-09-19 NOTE — Telephone Encounter (Signed)
 [name] with [facility] and she wants a call back in reference to this pt about a denied sleep study.  [name]'s number is [phone]  ext [phone] Thanks

## 2024-11-04 ENCOUNTER — Ambulatory Visit: Admitting: Obstetrics and Gynecology

## 2024-11-04 ENCOUNTER — Encounter: Payer: Self-pay | Admitting: Obstetrics and Gynecology

## 2024-11-04 VITALS — BP 135/70 | HR 72

## 2024-11-04 DIAGNOSIS — T8389XA Other specified complication of genitourinary prosthetic devices, implants and grafts, initial encounter: Secondary | ICD-10-CM | POA: Diagnosis not present

## 2024-11-04 DIAGNOSIS — N816 Rectocele: Secondary | ICD-10-CM

## 2024-11-04 DIAGNOSIS — N952 Postmenopausal atrophic vaginitis: Secondary | ICD-10-CM

## 2024-11-04 DIAGNOSIS — N993 Prolapse of vaginal vault after hysterectomy: Secondary | ICD-10-CM

## 2024-11-04 DIAGNOSIS — N8110 Cystocele, unspecified: Secondary | ICD-10-CM

## 2024-11-04 DIAGNOSIS — N813 Complete uterovaginal prolapse: Secondary | ICD-10-CM | POA: Diagnosis not present

## 2024-11-04 DIAGNOSIS — N898 Other specified noninflammatory disorders of vagina: Secondary | ICD-10-CM

## 2024-11-04 MED ORDER — ESTRADIOL 7.5 MCG/24HR VA RING: 1.0000 | VAGINAL_RING | VAGINAL | 3 refills | Status: AC

## 2024-11-04 NOTE — Patient Instructions (Signed)
 Please use the estrogen cream nightly until you return.   Pick up the E-string and bring to your next appointment.   We will re-insert the pessary and E-string at the same time.

## 2024-11-04 NOTE — Progress Notes (Signed)
 [facility]   Subjective:     Chief Complaint:  Chief Complaint  Patient presents with   Follow-up    3 month pessary check   History of Present Illness: Sara Mason is a 78 y.o. female with stage III pelvic organ prolapse who presents for a pessary check. She is using a size #3 incontinence ring pessary. The pessary has been working well and she has no complaints. She is using vaginal estrogen x3 weekly. She endorses recent vaginal bleeding that is not heavy.   Past Medical History: Patient  has a past medical history of History of oral surgery and Osteomyelitis (HCC).   Past Surgical History: She  has a past surgical history that includes Abdominal hysterectomy and Cataract extraction (Right, 2024).   Medications: She has a current medication list which includes the following prescription(s): carbidopa -levodopa  er, estradiol , minocycline, and gabapentin.   Allergies: Patient is allergic to aspirin, ibuprofen, and short ragweed pollen ext.   Social History: Patient  reports that she has never smoked. She has never used smokeless tobacco. She reports that she does not currently use alcohol . She reports that she does not use drugs.      Objective:    Physical Exam: BP 135/70   Pulse 72  Gen: No apparent distress, A&O x 3. Detailed Urogynecologic Evaluation:  Pelvic Exam: Normal external female genitalia; Bartholin's and Skene's glands normal in appearance; urethral meatus with caruncle, no urethral masses or discharge. The pessary was noted to be in place. It was removed and cleaned. Speculum exam revealed mild excoriation in the vagina. The pessary was left out to allow for tissue to heal.     Assessment/Plan:    Assessment: Sara Mason is a 78 y.o. with stage III pelvic organ prolapse and stress incontinence here for a pessary check. She is doing well.  Plan: She will use estrogen for the next week. We will plan to insert E-string and re-insert  pessary in a little over a week to give the tissues time to heal. Patient is agreeable to plan of care. Due to her parkinsons, she is not able to get the estrogen cream behind the pessary so an E-string would be much more supportive for her tissues than I believe the estrogen cream has been.

## 2024-11-19 ENCOUNTER — Encounter: Payer: Self-pay | Admitting: Obstetrics and Gynecology

## 2024-11-19 ENCOUNTER — Ambulatory Visit: Admitting: Obstetrics and Gynecology

## 2024-11-19 VITALS — BP 138/81 | HR 78

## 2024-11-19 DIAGNOSIS — N813 Complete uterovaginal prolapse: Secondary | ICD-10-CM | POA: Diagnosis not present

## 2024-11-19 DIAGNOSIS — N952 Postmenopausal atrophic vaginitis: Secondary | ICD-10-CM

## 2024-11-19 DIAGNOSIS — N993 Prolapse of vaginal vault after hysterectomy: Secondary | ICD-10-CM

## 2024-11-19 DIAGNOSIS — N816 Rectocele: Secondary | ICD-10-CM

## 2024-11-19 DIAGNOSIS — N8110 Cystocele, unspecified: Secondary | ICD-10-CM

## 2024-11-19 NOTE — Progress Notes (Signed)
 [facility]   Subjective:     Chief Complaint:  Chief Complaint  Patient presents with   Pessary Check    Sara Mason is a 78 y.o. female is here for pessary check.   History of Present Illness: Sara Mason is a 78 y.o. female with stage III pelvic organ prolapse who presents for a pessary check. She has had the pessary out since she had bleeding at last visit, which she attributes to a harder bowel movement causing friction. Patient has brought her E-string today.   Past Medical History: Patient  has a past medical history of History of oral surgery and Osteomyelitis (HCC).   Past Surgical History: She  has a past surgical history that includes Abdominal hysterectomy and Cataract extraction (Right, 2024).   Medications: She has a current medication list which includes the following prescription(s): carbidopa -levodopa  er, estradiol , gabapentin, and minocycline.   Allergies: Patient is allergic to aspirin, ibuprofen, and short ragweed pollen ext.   Social History: Patient  reports that she has never smoked. She has never used smokeless tobacco. She reports that she does not currently use alcohol . She reports that she does not use drugs.      Objective:    Physical Exam: BP 138/81   Pulse 78  Gen: No apparent distress, A&O x 3. Detailed Urogynecologic Evaluation:  Pelvic Exam: Normal external female genitalia; Bartholin's and Skene's glands normal in appearance; urethral meatus normal in appearance, no urethral masses or discharge. The pessary was replaced with a #3 RWS and the E-string was placed. It stayed in place with valsalva and cough as well as urination. Patient denies discomfort.    Assessment/Plan:    Assessment: Sara Mason is a 78 y.o. with stage III pelvic organ prolapse here for a pessary check. She is doing well.  Plan: She will keep the pessary in place until next visit. She will continue to use estrogen in the form of the  E-string. She will follow-up in 3 months for a pessary check or sooner as needed.  All questions were answered.

## 2024-11-19 NOTE — Patient Instructions (Signed)
 Please call if you have bleeding, pain, or irritation.

## 2024-11-20 ENCOUNTER — Encounter: Payer: Self-pay | Admitting: Obstetrics and Gynecology

## 2025-02-13 ENCOUNTER — Ambulatory Visit: Admitting: Neurology

## 2025-02-25 ENCOUNTER — Ambulatory Visit: Admitting: Obstetrics and Gynecology
